# Patient Record
Sex: Female | Born: 1950 | Race: White | Hispanic: No | Marital: Married | Smoking: Never smoker
Health system: Southern US, Community
[De-identification: ages and names within clinical notes are randomized; demographics above are authoritative.]

## PROBLEM LIST (undated history)

## (undated) DIAGNOSIS — K5792 Diverticulitis of intestine, part unspecified, without perforation or abscess without bleeding: Secondary | ICD-10-CM

## (undated) DIAGNOSIS — E119 Type 2 diabetes mellitus without complications: Secondary | ICD-10-CM

## (undated) DIAGNOSIS — C439 Malignant melanoma of skin, unspecified: Secondary | ICD-10-CM

## (undated) DIAGNOSIS — E7800 Pure hypercholesterolemia, unspecified: Secondary | ICD-10-CM

## (undated) HISTORY — PX: SHOULDER SURGERY: SHX246

## (undated) HISTORY — PX: ABDOMINAL HYSTERECTOMY: SHX81

---

## 2003-04-05 ENCOUNTER — Other Ambulatory Visit: Admission: RE | Admit: 2003-04-05 | Discharge: 2003-04-05 | Payer: Self-pay | Admitting: Obstetrics and Gynecology

## 2004-02-17 ENCOUNTER — Ambulatory Visit: Payer: Self-pay | Admitting: Family Medicine

## 2005-09-15 ENCOUNTER — Ambulatory Visit: Payer: Self-pay | Admitting: Internal Medicine

## 2006-03-09 ENCOUNTER — Other Ambulatory Visit: Payer: Self-pay

## 2006-03-17 ENCOUNTER — Ambulatory Visit: Payer: Self-pay | Admitting: Obstetrics and Gynecology

## 2006-11-01 ENCOUNTER — Ambulatory Visit: Payer: Self-pay | Admitting: Unknown Physician Specialty

## 2006-11-02 ENCOUNTER — Ambulatory Visit: Payer: Self-pay | Admitting: Gastroenterology

## 2007-12-07 ENCOUNTER — Ambulatory Visit: Payer: Self-pay | Admitting: Internal Medicine

## 2008-01-02 ENCOUNTER — Ambulatory Visit: Payer: Self-pay | Admitting: Unknown Physician Specialty

## 2010-01-05 ENCOUNTER — Ambulatory Visit: Payer: Self-pay | Admitting: Urology

## 2010-01-26 ENCOUNTER — Ambulatory Visit: Payer: Self-pay | Admitting: Urology

## 2010-03-10 ENCOUNTER — Ambulatory Visit: Payer: Self-pay | Admitting: Unknown Physician Specialty

## 2010-03-17 ENCOUNTER — Ambulatory Visit: Payer: Self-pay | Admitting: Gynecologic Oncology

## 2010-03-29 ENCOUNTER — Ambulatory Visit: Payer: Self-pay | Admitting: Gynecologic Oncology

## 2010-09-17 ENCOUNTER — Ambulatory Visit: Payer: Self-pay | Admitting: Obstetrics and Gynecology

## 2010-09-18 ENCOUNTER — Ambulatory Visit: Payer: Self-pay | Admitting: Obstetrics and Gynecology

## 2010-09-28 ENCOUNTER — Ambulatory Visit: Payer: Self-pay | Admitting: Obstetrics and Gynecology

## 2010-10-01 LAB — PATHOLOGY REPORT

## 2012-01-18 ENCOUNTER — Ambulatory Visit: Payer: Self-pay | Admitting: Unknown Physician Specialty

## 2013-05-08 ENCOUNTER — Emergency Department: Payer: Self-pay | Admitting: Emergency Medicine

## 2013-05-08 ENCOUNTER — Encounter (HOSPITAL_COMMUNITY): Payer: Self-pay | Admitting: Emergency Medicine

## 2013-05-08 ENCOUNTER — Inpatient Hospital Stay (HOSPITAL_COMMUNITY)
Admission: EM | Admit: 2013-05-08 | Discharge: 2013-05-10 | DRG: 087 | Disposition: A | Discharge status: Home / Self Care | Payer: BC Managed Care – PPO | Attending: Surgery | Admitting: Surgery

## 2013-05-08 DIAGNOSIS — S069XAA Unspecified intracranial injury with loss of consciousness status unknown, initial encounter: Principal | ICD-10-CM | POA: Diagnosis present

## 2013-05-08 DIAGNOSIS — Y92009 Unspecified place in unspecified non-institutional (private) residence as the place of occurrence of the external cause: Secondary | ICD-10-CM

## 2013-05-08 DIAGNOSIS — S0101XA Laceration without foreign body of scalp, initial encounter: Secondary | ICD-10-CM | POA: Diagnosis present

## 2013-05-08 DIAGNOSIS — S069X9A Unspecified intracranial injury with loss of consciousness of unspecified duration, initial encounter: Secondary | ICD-10-CM | POA: Diagnosis present

## 2013-05-08 DIAGNOSIS — S42009A Fracture of unspecified part of unspecified clavicle, initial encounter for closed fracture: Secondary | ICD-10-CM

## 2013-05-08 DIAGNOSIS — S0181XA Laceration without foreign body of other part of head, initial encounter: Secondary | ICD-10-CM

## 2013-05-08 DIAGNOSIS — S52121A Displaced fracture of head of right radius, initial encounter for closed fracture: Secondary | ICD-10-CM | POA: Diagnosis present

## 2013-05-08 DIAGNOSIS — W108XXA Fall (on) (from) other stairs and steps, initial encounter: Secondary | ICD-10-CM | POA: Diagnosis present

## 2013-05-08 DIAGNOSIS — S42033A Displaced fracture of lateral end of unspecified clavicle, initial encounter for closed fracture: Secondary | ICD-10-CM | POA: Diagnosis present

## 2013-05-08 DIAGNOSIS — E785 Hyperlipidemia, unspecified: Secondary | ICD-10-CM | POA: Diagnosis present

## 2013-05-08 DIAGNOSIS — W19XXXA Unspecified fall, initial encounter: Secondary | ICD-10-CM

## 2013-05-08 DIAGNOSIS — I609 Nontraumatic subarachnoid hemorrhage, unspecified: Secondary | ICD-10-CM | POA: Diagnosis present

## 2013-05-08 DIAGNOSIS — S066X9A Traumatic subarachnoid hemorrhage with loss of consciousness of unspecified duration, initial encounter: Secondary | ICD-10-CM

## 2013-05-08 DIAGNOSIS — S52133A Displaced fracture of neck of unspecified radius, initial encounter for closed fracture: Secondary | ICD-10-CM | POA: Diagnosis present

## 2013-05-08 DIAGNOSIS — E7800 Pure hypercholesterolemia, unspecified: Secondary | ICD-10-CM | POA: Diagnosis present

## 2013-05-08 DIAGNOSIS — S066XAA Traumatic subarachnoid hemorrhage with loss of consciousness status unknown, initial encounter: Secondary | ICD-10-CM

## 2013-05-08 DIAGNOSIS — Z85820 Personal history of malignant melanoma of skin: Secondary | ICD-10-CM

## 2013-05-08 DIAGNOSIS — E119 Type 2 diabetes mellitus without complications: Secondary | ICD-10-CM | POA: Diagnosis present

## 2013-05-08 DIAGNOSIS — S42001A Fracture of unspecified part of right clavicle, initial encounter for closed fracture: Secondary | ICD-10-CM | POA: Diagnosis present

## 2013-05-08 HISTORY — DX: Malignant melanoma of skin, unspecified: C43.9

## 2013-05-08 HISTORY — DX: Pure hypercholesterolemia, unspecified: E78.00

## 2013-05-08 HISTORY — DX: Type 2 diabetes mellitus without complications: E11.9

## 2013-05-08 HISTORY — DX: Diverticulitis of intestine, part unspecified, without perforation or abscess without bleeding: K57.92

## 2013-05-08 LAB — CBC
HCT: 40.8 % (ref 35.0–47.0)
HGB: 13.8 g/dL (ref 12.0–16.0)
MCH: 29.5 pg (ref 26.0–34.0)
MCHC: 33.8 g/dL (ref 32.0–36.0)
MCV: 87 fL (ref 80–100)
Platelet: 350 10*3/uL (ref 150–440)
RBC: 4.67 10*6/uL (ref 3.80–5.20)
RDW: 13.7 % (ref 11.5–14.5)
WBC: 14.9 10*3/uL — AB (ref 3.6–11.0)

## 2013-05-08 LAB — BASIC METABOLIC PANEL
Anion Gap: 5 — ABNORMAL LOW (ref 7–16)
BUN: 13 mg/dL (ref 7–18)
CALCIUM: 8.9 mg/dL (ref 8.5–10.1)
CHLORIDE: 103 mmol/L (ref 98–107)
CREATININE: 0.6 mg/dL (ref 0.60–1.30)
Co2: 28 mmol/L (ref 21–32)
EGFR (Non-African Amer.): >60
Glucose: 148 mg/dL — ABNORMAL HIGH (ref 65–99)
OSMOLALITY: 275 (ref 275–301)
Potassium: 4 mmol/L (ref 3.5–5.1)
Sodium: 136 mmol/L (ref 136–145)

## 2013-05-08 LAB — GLUCOSE, CAPILLARY: Glucose-Capillary: 144 mg/dL — ABNORMAL HIGH (ref 70–99)

## 2013-05-08 LAB — APTT: Activated PTT: 28.6 secs (ref 23.6–35.9)

## 2013-05-08 LAB — PROTIME-INR
INR: 1
Prothrombin Time: 13.2 secs (ref 11.5–14.7)

## 2013-05-08 MED ORDER — MORPHINE SULFATE 4 MG/ML IJ SOLN
4.0000 mg | INTRAMUSCULAR | Status: DC | PRN
  Administered 2013-05-08 – 2013-05-09 (×3): 4 mg via INTRAVENOUS
  Filled 2013-05-08 (×3): qty 1

## 2013-05-08 MED ORDER — PANTOPRAZOLE SODIUM 40 MG IV SOLR
40.0000 mg | Freq: Every day | INTRAVENOUS | Status: DC
  Filled 2013-05-08: qty 40

## 2013-05-08 MED ORDER — PANTOPRAZOLE SODIUM 40 MG PO TBEC
40.0000 mg | DELAYED_RELEASE_TABLET | Freq: Every day | ORAL | Status: DC
  Administered 2013-05-09: 40 mg via ORAL
  Filled 2013-05-08: qty 1

## 2013-05-08 MED ORDER — ONDANSETRON HCL 4 MG/2ML IJ SOLN
4.0000 mg | Freq: Four times a day (QID) | INTRAMUSCULAR | Status: DC | PRN
  Administered 2013-05-08 – 2013-05-09 (×2): 4 mg via INTRAVENOUS
  Filled 2013-05-08 (×4): qty 2

## 2013-05-08 MED ORDER — MORPHINE SULFATE 2 MG/ML IJ SOLN: 1.0000 mg | INTRAMUSCULAR | Status: DC | PRN

## 2013-05-08 MED ORDER — POTASSIUM CHLORIDE IN NACL 20-0.9 MEQ/L-% IV SOLN
INTRAVENOUS | Status: DC
  Administered 2013-05-08: 1000 mL via INTRAVENOUS
  Filled 2013-05-08 (×2): qty 1000

## 2013-05-08 MED ORDER — ONDANSETRON HCL 4 MG PO TABS
4.0000 mg | ORAL_TABLET | Freq: Four times a day (QID) | ORAL | Status: DC | PRN
  Administered 2013-05-10: 4 mg via ORAL
  Filled 2013-05-08 (×2): qty 1

## 2013-05-08 MED ORDER — MORPHINE SULFATE 2 MG/ML IJ SOLN: 2.0000 mg | INTRAMUSCULAR | Status: DC | PRN

## 2013-05-08 NOTE — H&P (Signed)
History   Haley Ruiz is an 63 y.o. female.   Chief Complaint: This is a pleasant female transferred from [facility] after having fallen down steps. She denied loss of consciousness. She was found they are to have a small right subarachnoid hemorrhage on CT of the head. She was also found to have a right clavicle fracture. She was otherwise without injuries. Her GCS was 15. A scalp laceration was repaired and she was transferred here for neurosurgical evaluation. She denies neck pain, chest pain, or abdominal pain. Chief Complaint  Patient presents with  . Head Injury    Head Injury   Past Medical History  Diagnosis Date  . Diverticulitis   . Melanoma   . Diabetes mellitus without complication   . High cholesterol     Past Surgical History  Procedure Laterality Date  . Abdominal hysterectomy    . Shoulder surgery      History reviewed. No pertinent family history. Social History:  reports that she has never smoked. She has never used smokeless tobacco. She reports that she does not drink alcohol or use illicit drugs.  Allergies  No Known Allergies  Home Medications   (Not in a hospital admission)  Trauma Course  No results found for this or any previous visit (from the past 48 hour(s)). No results found.  Review of Systems  All other systems reviewed and are negative.    Blood pressure 122/70, pulse 94, temperature 99.3 F (37.4 C), temperature source Oral, resp. rate 25, SpO2 98.00%. Physical Exam  Constitutional: She is oriented to person, place, and time. She appears well-developed and well-nourished.  HENT:  Head: Normocephalic.  Right Ear: External ear normal.  Left Ear: External ear normal.  Nose: Nose normal.  Mouth/Throat: Oropharynx is clear and moist. No oropharyngeal exudate.  Small laceration the sutures on the right scalp  Eyes: Conjunctivae are normal. Pupils are equal, round, and reactive to light.  Neck: Normal range of motion. Neck  supple. No tracheal deviation present.  Cervical spine nontender  Cardiovascular: Normal rate, regular rhythm, normal heart sounds and intact distal pulses.   Respiratory: Effort normal and breath sounds normal. No respiratory distress. She has no wheezes. She has no rales. She exhibits no tenderness.  GI: Soft. Bowel sounds are normal. She exhibits no distension. There is no tenderness. There is no rebound.  Musculoskeletal: Normal range of motion. She exhibits tenderness.  Tenderness along the right clavicle distally  Neurological: She is alert and oriented to person, place, and time.  Skin: Skin is warm and dry. No rash noted. No erythema.  Psychiatric: Her behavior is normal. Judgment normal.     Assessment/Plan Patient status post fall with the following injuries:  1. 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 Neurosurgery has been consulted 2. Right clavicle fracture. Orthopedics has been consulted  Patient will be placed in the step down unit and a CAT scan of the head will be repeated in the morning.  Demetria Lightsey A 05/08/2013, 10:17 PM   Procedures

## 2013-05-08 NOTE — Consult Note (Signed)
     ORTHOPAEDIC CONSULTATION  REQUESTING PHYSICIAN: Trauma Md, MD  Chief Complaint: Right clavicle fx  HPI: Haley Ruiz is a 63 y.o. female who complains of  Fall down the stairs, pain at R shoulder  Past Medical History  Diagnosis Date  . Diverticulitis   . Melanoma   . Diabetes mellitus without complication   . High cholesterol    Past Surgical History  Procedure Laterality Date  . Abdominal hysterectomy    . Shoulder surgery     History   Social History  . Marital Status: Married    Spouse Name: N/A    Number of Children: N/A  . Years of Education: N/A   Social History Main Topics  . Smoking status: Never Smoker   . Smokeless tobacco: Never Used  . Alcohol Use: No  . Drug Use: No  . Sexual Activity: None   Other Topics Concern  . None   Social History Narrative  . None   History reviewed. No pertinent family history. No Known Allergies Prior to Admission medications   Medication Sig Start Date End Date Taking? Authorizing Provider  atorvastatin (LIPITOR) 10 MG tablet Take 10 mg by mouth daily.   Yes Historical Provider, MD  guaiFENesin (MUCINEX) 600 MG 12 hr tablet Take 1,200 mg by mouth 2 (two) times daily.   Yes Historical Provider, MD  metFORMIN (GLUCOPHAGE) 500 MG tablet Take 1,000 mg by mouth at bedtime.    Yes Historical Provider, MD   No results found.  Positive ROS: All other systems have been reviewed and were otherwise negative with the exception of those mentioned in the HPI and as above.  Labs cbc No results found for this basename: WBC, HGB, HCT, PLT,  in the last 72 hours  Labs inflam No results found for this basename: ESR, CRP,  in the last 72 hours  Labs coag No results found for this basename: INR, PT, PTT,  in the last 72 hours  No results found for this basename: NA, K, CL, CO2, GLUCOSE, BUN, CREATININE, CALCIUM,  in the last 72 hours  Physical Exam: Filed Vitals:   05/08/13 1915  BP: 124/59  Pulse: 88  Temp:   Resp: 17    General: Alert, no acute distress Cardiovascular: No pedal edema Respiratory: No cyanosis, no use of accessory musculature GI: No organomegaly, abdomen is soft and non-tender Skin: No lesions in the area of chief complaint Neurologic: Sensation intact distally Psychiatric: Patient is competent for consent with normal mood and affect Lymphatic: No axillary or cervical lymphadenopathy  MUSCULOSKELETAL:  SILT M/R/U nerve, 2+ radial pulse, +EPL/FPL/IO Compartments soft TTP at distal clavicle  Other extremities are atraumatic with painless ROM and NVI.  Assessment: Right distal clavicle fracture  Plan: Non-operative management Weight Bearing Status: Sling full time Dispo: ok for d/c from ortho standpoint.   F/u with me in 1-2wks.    Edmonia Lynch, D, MD Cell [phone]   05/08/2013 8:15 PM

## 2013-05-08 NOTE — Consult Note (Signed)
Reason for Consult:chi Referring Physician: [name] is an 63 y.o. female.  HPI: patient who fell down steps while carrying laundry. No loc. Went to [facility] and [facility]. C/o pain in the right shoulder  Past Medical History  Diagnosis Date  . Diverticulitis   . Melanoma   . Diabetes mellitus without complication   . High cholesterol     Past Surgical History  Procedure Laterality Date  . Abdominal hysterectomy    . Shoulder surgery      History reviewed. No pertinent family history.  Social History:  reports that she has never smoked. She has never used smokeless tobacco. She reports that she does not drink alcohol or use illicit drugs.  Allergies: No Known Allergies  Medications: seE HP  No results found for this or any previous visit (from the past 25 hour(s)).  No results found.  Review of Systems  Constitutional: Negative.   Eyes: Negative.   Respiratory: Negative.   Cardiovascular: Negative.   Gastrointestinal: Negative.   Genitourinary: Negative.   Musculoskeletal: Positive for joint pain.  Skin: Negative.   Neurological: Positive for headaches.  Endo/Heme/Allergies: Negative.   Psychiatric/Behavioral: Negative.    Blood pressure 124/59, pulse 88, temperature 99.3 F (37.4 C), temperature source Oral, resp. rate 17, SpO2 100.00%. Physical Exam HENT, LACERATION RIGHT FRONTAL AREA. NO CSF OR BLOOD IN EARS OR NOSE. Neck., no tenderness. Cv, nl. Lungs. Clear. [place]. Soft. Extremities, sling to right shoulder. Neuro, oriented x 3. No weakness with pain in right arm, sensory, nl. Ct head small traumatic sah with  No shift.  Assessment/Plan: Obeservation. Repeat ct head in am Haley Ruiz M 05/08/2013, 8:31 PM

## 2013-05-08 NOTE — ED Notes (Signed)
Called flow regarding delay in pt's inpatient bed. Flow states the pt was not put in her admit list. Finding a inpatient bed now

## 2013-05-08 NOTE — ED Provider Notes (Signed)
CSN: [id number]     Arrival date & time 05/08/13  1842 History   First MD Initiated Contact with Patient 05/08/13 1843     Chief Complaint  Patient presents with  . Head Injury     (Consider location/radiation/quality/duration/timing/severity/associated sxs/prior Treatment) HPI Comments: 63 year old female presents as a transfer from [place] after falling down the stairs and injuring her head. She's unsure she lost consciousness. She's not have any weakness or numbness. No neck pain. She was diagnosed with a broken distal right clavicle and possible subdural versus subarachnoid bleeding. It appears the CT scan was equivocal per the radiology read. Patient received morphine by EMS states her pain is improved. She had her laceration over her right temple sutured by the other ED.    Past Medical History  Diagnosis Date  . Diverticulitis   . Melanoma    Past Surgical History  Procedure Laterality Date  . Abdominal hysterectomy     No family history on file. History  Substance Use Topics  . Smoking status: Never Smoker   . Smokeless tobacco: Not on file  . Alcohol Use: No   OB History   Grav Para Term Preterm Abortions TAB SAB Ect Mult Living                 Review of Systems  Respiratory: Negative for shortness of breath.   Cardiovascular: Negative for chest pain.  Gastrointestinal: Negative for vomiting and abdominal pain.  Musculoskeletal: Negative for back pain, neck pain and neck stiffness.  Neurological: Positive for headaches. Negative for dizziness, weakness and light-headedness.  All other systems reviewed and are negative.      Allergies  Review of patient's allergies indicates no known allergies.  Home Medications  No current outpatient prescriptions on file. There were no vitals taken for this visit. Physical Exam  Nursing note and vitals reviewed. Constitutional: She is oriented to person, place, and time. She appears well-developed and well-nourished.  No distress.  HENT:  Head: Normocephalic.  Right Ear: External ear normal.  Left Ear: External ear normal.  Nose: Nose normal.  Mouth/Throat: Oropharynx is clear and moist.  3 cm laceration over her right temple with sutures in place  Eyes: EOM are normal. Pupils are equal, round, and reactive to light.  Neck: Normal range of motion. Neck supple.  No neck tenderness  Cardiovascular: Normal rate, regular rhythm, normal heart sounds and intact distal pulses.   Pulses:      Radial pulses are 2+ on the right side, and 2+ on the left side.  Pulmonary/Chest: Effort normal and breath sounds normal. No respiratory distress. She has no wheezes. She has no rales.  Abdominal: Soft. She exhibits no distension. There is no tenderness.  Musculoskeletal: She exhibits no edema.       Right shoulder: She exhibits tenderness (over distal clavicle).  Neurological: She is alert and oriented to person, place, and time. She has normal strength. No cranial nerve deficit or sensory deficit. GCS eye subscore is 4. GCS verbal subscore is 5. GCS motor subscore is 6.  CN 2-12 grossly intact. 5/5 strength in all 4 extremities.  Skin: Skin is warm and dry. She is not diaphoretic. No pallor.    ED Course  Procedures (including critical care time) Labs Review Labs Reviewed - No data to display Imaging Review No results found.  EKG Interpretation   None       MDM   Final diagnoses:  Fall at home  Forehead laceration  Right  clavicle fracture    Patient is well-appearing, and has a nonfocal neuro exam. Dr. Ninfa Linden was here on arrival as well and will admit her for close neurologic observation and neurosurgical and orthopedic consultation.    Ephraim Hamburger, MD 05/09/13 [phone]

## 2013-05-08 NOTE — ED Notes (Signed)
Per EMS pt fell down 7 steps at home, went to [place] by EMS. Pt had laceration repair to right forehead. No LOC. No blood thinners. CT scan showed possible subarachnoid hemmorrhage. Pt has pain to right arm, clavicle broken. Alert and oriented x 4. LAC 20G. Pt transferred by carelink from [facility]. Pt received morphine 4mg  at 1806. VS BP 130/71 HR94 O295%RA.

## 2013-05-09 ENCOUNTER — Inpatient Hospital Stay (HOSPITAL_COMMUNITY): Payer: BC Managed Care – PPO

## 2013-05-09 ENCOUNTER — Encounter (HOSPITAL_COMMUNITY): Payer: Self-pay | Admitting: Radiology

## 2013-05-09 LAB — GLUCOSE, CAPILLARY
GLUCOSE-CAPILLARY: 128 mg/dL — AB (ref 70–99)
Glucose-Capillary: 109 mg/dL — ABNORMAL HIGH (ref 70–99)
Glucose-Capillary: 133 mg/dL — ABNORMAL HIGH (ref 70–99)
Glucose-Capillary: 171 mg/dL — ABNORMAL HIGH (ref 70–99)

## 2013-05-09 LAB — CBC
HEMATOCRIT: 36.6 % (ref 36.0–46.0)
Hemoglobin: 12.1 g/dL (ref 12.0–15.0)
MCH: 29 pg (ref 26.0–34.0)
MCHC: 33.1 g/dL (ref 30.0–36.0)
MCV: 87.8 fL (ref 78.0–100.0)
Platelets: 319 10*3/uL (ref 150–400)
RBC: 4.17 MIL/uL (ref 3.87–5.11)
RDW: 13.4 % (ref 11.5–15.5)
WBC: 9.5 10*3/uL (ref 4.0–10.5)

## 2013-05-09 LAB — BASIC METABOLIC PANEL
BUN: 13 mg/dL (ref 6–23)
CO2: 23 mEq/L (ref 19–32)
Calcium: 8.1 mg/dL — ABNORMAL LOW (ref 8.4–10.5)
Chloride: 102 mEq/L (ref 96–112)
Creatinine, Ser: 0.55 mg/dL (ref 0.50–1.10)
Glucose, Bld: 147 mg/dL — ABNORMAL HIGH (ref 70–99)
Potassium: 5 mEq/L (ref 3.7–5.3)
SODIUM: 138 meq/L (ref 137–147)

## 2013-05-09 LAB — MRSA PCR SCREENING: MRSA by PCR: NEGATIVE

## 2013-05-09 MED ORDER — ATORVASTATIN CALCIUM 10 MG PO TABS
10.0000 mg | ORAL_TABLET | Freq: Every day | ORAL | Status: DC
  Administered 2013-05-09: 10 mg via ORAL
  Filled 2013-05-09 (×2): qty 1

## 2013-05-09 MED ORDER — HYDROCODONE-ACETAMINOPHEN 5-325 MG PO TABS
1.0000 | ORAL_TABLET | ORAL | Status: DC | PRN
  Administered 2013-05-09: 1 via ORAL
  Administered 2013-05-09 – 2013-05-10 (×3): 2 via ORAL
  Filled 2013-05-09: qty 1
  Filled 2013-05-09 (×3): qty 2

## 2013-05-09 MED ORDER — SODIUM CHLORIDE 0.9 % IV SOLN
INTRAVENOUS | Status: DC
  Administered 2013-05-09: 10 mL/h via INTRAVENOUS

## 2013-05-09 MED ORDER — GUAIFENESIN ER 600 MG PO TB12
1200.0000 mg | ORAL_TABLET | Freq: Two times a day (BID) | ORAL | Status: DC
  Administered 2013-05-09 – 2013-05-10 (×3): 1200 mg via ORAL
  Filled 2013-05-09 (×4): qty 2

## 2013-05-09 NOTE — Progress Notes (Signed)
LOS: 1 day   Subjective: Pt feels good, pain in right shoulder/clavicle and head.  No N/V, no trouble with memory or balance.  Tolerating some clears, but appetite low.  Having BM's and urinating well.  Pain well controlled.  Objective: Vital signs in last 24 hours: Temp:  [98.3 F (36.8 C)-99.3 F (37.4 C)] 98.3 F (36.8 C) (02/11 0723) Pulse Rate:  [84-94] 85 (02/11 0412) Resp:  [16-26] 18 (02/11 0412) BP: (90-133)/(46-70) 107/49 mmHg (02/11 0412) SpO2:  [94 %-100 %] 97 % (02/11 0412) Weight:  [171 lb 8.3 oz (77.8 kg)] 171 lb 8.3 oz (77.8 kg) (02/10 2300) Last BM Date: 05/08/13  Lab Results:  CBC  Recent Labs  05/09/13 0231  WBC 9.5  HGB 12.1  HCT 36.6  PLT 319   BMET  Recent Labs  05/09/13 0231  NA 138  K 5.0  CL 102  CO2 23  GLUCOSE 147*  BUN 13  CREATININE 0.55  CALCIUM 8.1*    Imaging: Dg Clavicle Right  05/09/2013   CLINICAL DATA:  Right shoulder pain since a fall yesterday.  EXAM: RIGHT CLAVICLE - 2+ VIEWS  COMPARISON:  None.  FINDINGS: There is a slightly displaced longitudinal oblique fracture through the distal right clavicle.  IMPRESSION: Distal right clavicle fracture.   Electronically Signed   By: Rozetta Nunnery M.D.   On: 05/09/2013 08:02   Ct Head Without Contrast  05/09/2013   CLINICAL DATA:  Traumatic brain injury.  Follow-up evaluation.  EXAM: CT HEAD WITHOUT CONTRAST  TECHNIQUE: Contiguous axial images were obtained from the base of the skull through the vertex without intravenous contrast.  COMPARISON:  CT of the head May 08, 2013 at [facility].  FINDINGS: The curvilinear extra-axial hyperdensity within the right frontal sulcus on prior examination is no longer apparent. No extra-axial fluid collections on today's examination.  The ventricles and sulci are normal for age. No intraparenchymal hemorrhage, mass effect nor midline shift. Minimal supratentorial white matter hypodensities are within normal range for patient's  age and though non-specific suggest sequelae of chronic small vessel ischemic disease. No acute large vascular territory infarcts.  Basal cisterns are patent. Minimal calcific atherosclerosis of the carotid siphons. Calcified subcentimeter cranial gland.  Small right frontal scalp hematoma with bubbles of gas most consistent with laceration, no radiopaque foreign bodies. No skull fracture. Mild paranasal sinus mucosal thickening without air-fluid levels. . The included ocular globes and orbital contents are non-suspicious.  IMPRESSION: Small right frontal scalp hematoma with apparent laceration, no underlying skull fracture and no acute intracranial process. Resolution of right frontal subarachnoid blood versus artifact from prior CT.   Electronically Signed   By: Elon Alas   On: 05/09/2013 03:47     PE: General: pleasant, WD/WN white female who is laying in bed in NAD HEENT: head is normocephalic, laceration to right scalp.  Sclera are noninjected.  PERRL.  Ears and nose without any masses or lesions, no drainage.  Mouth is pink and moist Heart: regular, rate, and rhythm.  Normal s1,s2. No obvious murmurs, gallops, or rubs noted.  Palpable radial and pedal pulses bilaterally Lungs: CTAB, no wheezes, rhonchi, or rales noted.  Respiratory effort nonlabored Abd: soft, NT/ND, +BS, no masses, hernias, or organomegaly MS: Right shoulder with edema and tenderness over right clavicle, rest of extremties without edema, ecchymosis, or cyanosis, CSM intact b/l x 4 extremities Skin: warm and dry with no masses, lesions, or rashes Psych: A&Ox3 with an appropriate affect, no  amnesia noted in current or historical events, recalls the accident well   Assessment/Plan: Fall down stairs TBI/Right SAH - pending repeat CT head Scalp laceration - repaired at [place] Right clavicle fx - non-op with sling, f/u with Dr. Percell Miller VTE - SCD's, no pharm DVT proph due to head bleed FEN - on clears will advance after  CT scan to soft Dispo -- Depending on CT scan/therapies may be able to go home today vs tomorrow, therapies after CT scan   Excell Seltzer Pager: Phelps PA Pager: [phone]   05/09/2013

## 2013-05-09 NOTE — Progress Notes (Signed)
UR completed.  Ruchy Wildrick, RN BSN M[facility] CCM Trauma/Neuro ICU Case Manager [phone]  

## 2013-05-09 NOTE — Discharge Instructions (Signed)
Keep your Right arm in a sling full time.   Ok to remove sling for showering

## 2013-05-09 NOTE — Evaluation (Signed)
Physical Therapy Evaluation Patient Details Name: Haley Ruiz MRN: [id number] DOB: May 03, 1950 Today's Date: 05/09/2013 Time: 6073-7106 PT Time Calculation (min): 22 min  PT Assessment / Plan / Recommendation History of Present Illness  This is a pleasant female transferred from [facility] after having fallen down steps. She denied loss of consciousness. She was found they are to have a small right subarachnoid hemorrhage on CT of the head. She was also found to have a right clavicle fracture. She was otherwise without injuries. Her GCS was 15. A scalp laceration was repaired and she was transferred here for neurosurgical evaluation. She denies neck pain, chest pain, or abdominal pain.  Clinical Impression  Pt adm due to the above. Presents with limitations in mobility due to deficits indicated below (see PT problem list). Pt to benefit from skilled acute PT to increase independence with mobility prior to returning home with husband. Pt educated on TBI signs/symptoms. Pt wearing Rt immobilizer on shoulder at this time; has sling ordered per MD. Will benefit from acute OT prior to D/C.   PT Assessment  Patient needs continued PT services    Follow Up Recommendations  No PT follow up;Other (comment);Supervision/Assistance - 24 hour (may need PT when Rt shoulder is medically cleared )    Does the patient have the potential to tolerate intense rehabilitation      Barriers to Discharge        Equipment Recommendations  None recommended by PT    Recommendations for Other Services OT consult   Frequency Min 4X/week    Precautions / Restrictions Precautions Precautions: Fall Precaution Comments: reports this to be her only recent fall  Restrictions Weight Bearing Restrictions: No   Pertinent Vitals/Pain 7/10; Rt shoulder. patient repositioned for comfort with Rt shoulder elevated.       Mobility  Bed Mobility Overal bed mobility: Modified Independent Transfers Overall  transfer level: Needs assistance Equipment used: 1 person hand held assist Transfers: Sit to/from Stand Sit to Stand: Supervision General transfer comment: supervision for safety; pt reaching for support of Lt UE; cues for sequencing; no LOB noted; min sway  Ambulation/Gait Ambulation/Gait assistance: Supervision;Min guard Ambulation Distance (Feet): 250 Feet Assistive device: None Gait Pattern/deviations: Decreased stride length;Step-through pattern;Narrow base of support Gait velocity: decreased; guarded due to pain  Gait velocity interpretation: Below normal speed for age/gender General Gait Details: initially pt min guard for ambulation due to minimal sway noted; progressed to supervision; decreased speed due to pain/guarded  Stairs: Yes Stairs assistance: Min guard Stair Management: One rail Left;Step to pattern;Forwards Number of Stairs: 2 General stair comments: min guard for safety; cues for sequencing          PT Diagnosis: Abnormality of gait;Acute pain  PT Problem List: Decreased activity tolerance;Decreased balance;Decreased mobility;Pain;Decreased knowledge of precautions PT Treatment Interventions: DME instruction;Gait training;Stair training;Functional mobility training;Therapeutic activities;Therapeutic exercise;Balance training;Neuromuscular re-education;Patient/family education     PT Goals(Current goals can be found in the care plan section) Acute Rehab PT Goals Patient Stated Goal: to go home today or tomorrow PT Goal Formulation: With patient Time For Goal Achievement: 05/23/13 Potential to Achieve Goals: Good  Visit Information  Last PT Received On: 05/09/13 Assistance Needed: +1 History of Present Illness: This is a pleasant female transferred from [facility] after having fallen down steps. She denied loss of consciousness. She was found they are to have a small right subarachnoid hemorrhage on CT of the head. She was also found to have a right  clavicle fracture. She  was otherwise without injuries. Her GCS was 15. A scalp laceration was repaired and she was transferred here for neurosurgical evaluation. She denies neck pain, chest pain, or abdominal pain.       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 Dominant Hand: Right    Cognition  Cognition Arousal/Alertness: Awake/alert Behavior During Therapy: WFL for tasks assessed/performed Overall Cognitive Status: Within Functional Limits for tasks assessed    Extremity/Trunk Assessment Upper Extremity Assessment Upper Extremity Assessment: Defer to OT evaluation (Limited Rt UE; in immobilizer ) Lower Extremity Assessment Lower Extremity Assessment: Overall WFL for tasks assessed Cervical / Trunk Assessment Cervical / Trunk Assessment: Normal   Balance Balance Overall balance assessment: Needs assistance;History of Falls Sitting-balance support: Feet supported;No upper extremity supported Sitting balance-Leahy Scale: Good Standing balance support: During functional activity;No upper extremity supported Standing balance-Leahy Scale: Fair  End of Session PT - End of Session Equipment Utilized During Treatment: Gait belt;Other (comment) (Rt shoulder immobilizer ) Activity Tolerance: Patient tolerated treatment well Patient left: in chair;with call bell/phone within reach;with  family/visitor present Nurse Communication: Mobility status;Precautions  GP     Haley Ruiz, Haley Ruiz 05/09/2013, 11:29 AM

## 2013-05-09 NOTE — Evaluation (Signed)
Occupational Therapy Evaluation Patient Details Name: Haley Ruiz MRN: [id number] DOB: [birth year] Today's Date: 05/09/2013 Time: 7253-6644 OT Time Calculation (min): 21 min  OT Assessment / Plan / Recommendation History of present illness This is a pleasant female transferred from [facility] after having fallen down steps. She denied loss of consciousness. She was found they are to have a small right subarachnoid hemorrhage on CT of the head. She was also found to have a right clavicle fracture. She was otherwise without injuries. Her GCS was 15. A scalp laceration was repaired and she was transferred here for neurosurgical evaluation. She denies neck pain, chest pain, or abdominal pain.   Clinical Impression   Pt presents with below problem list. Pt independent with ADLs, PTA. Feel pt will benefit from acute OT to increase independence prior to d/c.     OT Assessment  Patient needs continued OT Services    Follow Up Recommendations  No OT follow up;Supervision - Intermittent (when OOB/mobility)   Barriers to Discharge      Equipment Recommendations  Other (comment) (may benefit from shower chair)    Recommendations for Other Services    Frequency  Min 2X/week    Precautions / Restrictions Precautions Precautions: Fall Precaution Comments: reports this to be her only recent fall  Restrictions Weight Bearing Restrictions: Yes RUE Weight Bearing: Non weight bearing Other Position/Activity Restrictions: no pushing, pulling, lifting   Pertinent Vitals/Pain Pain 3-4/10 in Right Shoulder. C/o elbow pain with exercises. Nurse notified of elbow pain.     ADL  Grooming: Wash/dry face;Teeth care;Min guard Where Assessed - Grooming: Unsupported sitting;Supported standing Lower Body Dressing: Min guard Where Assessed - Lower Body Dressing: Unsupported sit to stand Toilet Transfer: Min Psychiatric nurse Method: Sit to Loss adjuster, chartered: Regular height  toilet Tub/Shower Transfer Method: Not assessed Equipment Used: Gait belt;Other (comment) (shoulder sling) Transfers/Ambulation Related to ADLs: Min guard ADL Comments: Reviewed shoulder information with pt. She became nauseous during session.  Educated/demonstrated on donning/doffing shoulder sling    OT Diagnosis: Acute pain  OT Problem List: Decreased activity tolerance;Impaired balance (sitting and/or standing);Decreased knowledge of use of DME or AE;Decreased knowledge of precautions;Pain;Impaired UE functional use OT Treatment Interventions: Self-care/ADL training;Therapeutic exercise;DME and/or AE instruction;Therapeutic activities;Patient/family education;Balance training   OT Goals(Current goals can be found in the care plan section) Acute Rehab OT Goals Patient Stated Goal: not stated OT Goal Formulation: With patient Time For Goal Achievement: 05/16/13 Potential to Achieve Goals: Good ADL Goals Pt Will Perform Upper Body Dressing: with modified independence;with caregiver independent in assisting;sitting Pt Will Transfer to Toilet: with modified independence;ambulating;regular height toilet Additional ADL Goal #1: Pt will independently perform HEP for RUE. Additional ADL Goal #2: Pt/caregiver will be independent in performing ADLs while maintaining precautions.   Visit Information  Last OT Received On: 05/09/13 Assistance Needed: +1 History of Present Illness: This is a pleasant female transferred from [facility] after having fallen down steps. She denied loss of consciousness. She was found they are to have a small right subarachnoid hemorrhage on CT of the head. She was also found to have a right clavicle fracture. She was otherwise without injuries. Her GCS was 15. A scalp laceration was repaired and she was transferred here for neurosurgical evaluation. She denies neck pain, chest pain, or abdominal pain.       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 Dominant Hand: Right         Vision/Perception     Solicitor Arousal/Alertness: Awake/alert Behavior During Therapy: WFL for tasks assessed/performed Overall Cognitive Status: Within Functional Limits for tasks assessed    Extremity/Trunk Assessment Upper Extremity Assessment Upper Extremity Assessment: RUE deficits/detail RUE Deficits / Details: clavicle fx Lower Extremity Assessment Lower Extremity Assessment: Defer to PT evaluation     Mobility Bed Mobility Overal bed mobility: Needs Assistance Bed Mobility: Sit to Supine;Supine to Sit Supine to sit: Supervision Sit to supine: Supervision General bed mobility comments: supervision to be sure she maintained precautions. Transfers Overall transfer level: Needs assistance Equipment used: None Transfers: Sit to/from Stand Sit to Stand: Min guard General transfer comment: Cues for technique for toilet transfer.     Exercise Shoulder Exercises Elbow Flexion: AROM;Right;10 reps;Standing Elbow Extension: AROM;Right;10 reps;Standing Wrist Flexion: AROM;Right;10 reps;Standing Wrist Extension: AROM;Right;10 reps;Standing Digit Composite Flexion: AROM;Right;10 reps;Standing Composite Extension: AROM;Right;10 reps;Standing Donning/doffing shirt without moving shoulder:  (educated) Method for sponge bathing under operated UE:   (pt able to verbalize) Donning/doffing sling/immobilizer:  (educated/demonstrated) Correct positioning of sling/immobilizer: Patient able to independently direct caregiver ROM for elbow, wrist and digits of operated UE: Supervision/safety Sling wearing schedule (on at all times/off for ADL's):  (educated) Proper positioning of operated UE when showering:  (educated) Positioning of UE while sleeping:  (educated)   Balance     End of Session OT - End of Session Equipment Utilized During Treatment: Gait belt Activity Tolerance: Other (comment) (nauseous) Patient left: in bed;with call bell/phone within reach;with bed alarm set Nurse Communication:  (pain in elbow; nauseous)  [place], [place] L OTR/L [phone] 05/09/2013, 5:11 PM

## 2013-05-09 NOTE — Progress Notes (Signed)
Report called to Joelene Millin, RN 4N. Q4 hour neuro checks, no deficits. Husband at bedside. VSS. All belongings sent with patient. No meds to send. Pt transported via wheelchair by NT. eICU and CCMT notified of transfer. Will continue to monitor.

## 2013-05-09 NOTE — Progress Notes (Signed)
Orthopedic Tech Progress Note Patient Details:  Haley Ruiz 07/16/1950 [id number]  Ortho Devices Type of Ortho Device: Sling immobilizer Ortho Device/Splint Interventions: Application   Cammer, Theodoro Parma 05/09/2013, 3:00 PM

## 2013-05-09 NOTE — Progress Notes (Signed)
Patient ID: Haley Ruiz, female   DOB: 1950/04/24, 63 y.o.   MRN: [id number] Neuro stable, devrease of ha. Ct head shows resolution of traumatic csf. From our point she can be dc and be f/u by [place] PRN

## 2013-05-09 NOTE — Progress Notes (Signed)
Repeat CT head this AM was without worsening.  Okay to transfer to the floor.  GCS 15.  KVO IVF, PT/OT, advance diet, to floor later today.  No additional CT head required.  This patient has been seen and I agree with the findings and treatment plan.  Kathryne Eriksson. Dahlia Bailiff, MD, [place] [phone] (pager) [phone] (direct pager) Trauma Surgeon

## 2013-05-10 ENCOUNTER — Inpatient Hospital Stay (HOSPITAL_COMMUNITY): Payer: BC Managed Care – PPO

## 2013-05-10 DIAGNOSIS — W19XXXA Unspecified fall, initial encounter: Secondary | ICD-10-CM | POA: Diagnosis present

## 2013-05-10 DIAGNOSIS — S0101XA Laceration without foreign body of scalp, initial encounter: Secondary | ICD-10-CM | POA: Diagnosis present

## 2013-05-10 DIAGNOSIS — K5790 Diverticulosis of intestine, part unspecified, without perforation or abscess without bleeding: Secondary | ICD-10-CM | POA: Insufficient documentation

## 2013-05-10 DIAGNOSIS — E785 Hyperlipidemia, unspecified: Secondary | ICD-10-CM | POA: Insufficient documentation

## 2013-05-10 DIAGNOSIS — S42001A Fracture of unspecified part of right clavicle, initial encounter for closed fracture: Secondary | ICD-10-CM | POA: Diagnosis present

## 2013-05-10 DIAGNOSIS — S52121A Displaced fracture of head of right radius, initial encounter for closed fracture: Secondary | ICD-10-CM | POA: Diagnosis present

## 2013-05-10 DIAGNOSIS — E119 Type 2 diabetes mellitus without complications: Secondary | ICD-10-CM | POA: Insufficient documentation

## 2013-05-10 LAB — GLUCOSE, CAPILLARY: GLUCOSE-CAPILLARY: 116 mg/dL — AB (ref 70–99)

## 2013-05-10 MED ORDER — ONDANSETRON HCL 4 MG PO TABS: 4.0000 mg | ORAL_TABLET | Freq: Four times a day (QID) | ORAL | Status: AC | PRN

## 2013-05-10 MED ORDER — HYDROCODONE-ACETAMINOPHEN 5-325 MG PO TABS: 1.0000 | ORAL_TABLET | ORAL | Status: AC | PRN

## 2013-05-10 NOTE — Progress Notes (Signed)
I participated in the care of this patient and agree with the above history, physical and evaluation. I performed a review of the history and a physical exam as detailed    R elbow xray: minimally displaced radial neck fracture Plan for non-ooperative care of this as well. Sling full time, limit elbow ROM for 2-3wks then as tolerated. NWB RUE  Carole Binning MD

## 2013-05-10 NOTE — Progress Notes (Signed)
PT Cancellation Note  Patient Details Name: Haley Ruiz MRN: [id number] DOB: Sep 04, 1950   Cancelled Treatment:    Reason Eval/Treat Not Completed: Patient at procedure or test/unavailable. Patient going for Xrays. Will follow up.    [name] 05/10/2013, 8:48 AM

## 2013-05-10 NOTE — Progress Notes (Signed)
     Subjective:  Patient reports pain as moderate.  Patient sitting in bed, alert, complaining of pain in her right elbow.    Objective:   VITALS:   Filed Vitals:   05/09/13 1913 05/09/13 2041 05/10/13 0222 05/10/13 0513  BP: 137/60 116/54 106/51 137/58  Pulse: 81 83 80 82  Temp: 98 F (36.7 C) 99 F (37.2 C) 98.2 F (36.8 C) 99 F (37.2 C)  TempSrc: Oral Oral Oral Oral  Resp: 18 18 18 18   Height:      Weight:      SpO2: 100% 100% 100% 98%    Neurovascular intact Sensation intact distally Intact pulses distally Incision: dressing C/D/I and no drainage Able to supinate and pronate RUE.  Pain with supination.   Able to flex, extend wrist.  Able to flex, extend, abduct all fingers.   Lab Results  Component Value Date   WBC 9.5 05/09/2013   HGB 12.1 05/09/2013   HCT 36.6 05/09/2013   MCV 87.8 05/09/2013   PLT 319 05/09/2013     Assessment/Plan:     Active Problems:   TBI (traumatic brain injury)   Advance diet Up with therapy Sling at all times NWB RUE X-ray of right elbow ordered Continue plan per trauma Dry dressing prn   Remonia Richter 05/10/2013, 7:16 AM   Edmonia Lynch MD [phone]

## 2013-05-10 NOTE — Progress Notes (Signed)
Patient ID: Haley Ruiz, female   DOB: [birth year], 63 y.o.   MRN: [id number] Had  One episode of lightheadness and some nausea. Neuro stable

## 2013-05-10 NOTE — Discharge Summary (Signed)
Physician Discharge Summary  Patient ID: Haley Ruiz MRN: [id number] DOB/AGE: 61-Aug-1952 61 y.o.  Admit date: 05/08/2013 Discharge date: 05/10/2013  Discharge Diagnoses Patient Active Problem List   Diagnosis Date Noted  . Fall 05/10/2013  . Right clavicle fracture 05/10/2013  . Right radial head fracture 05/10/2013  . Scalp laceration 05/10/2013  . Diverticular disease 05/10/2013  . DM (diabetes mellitus) 05/10/2013  . Hyperlipidemia 05/10/2013  . TBI (traumatic brain injury) 05/08/2013    Consultants Dr. Edmonia Lynch for orthopedic surgery  Dr. Leeroy Cha for neurosurgery   Procedures None (repair of scalp laceration done at outside hospital)   HPI: [name] was transferred from [facility] after having fallen down some steps. She denied loss of consciousness. She was found there to have a small right subarachnoid hemorrhage on CT of the head. She was also found to have a right clavicle fracture. Her GCS was 15. A scalp laceration was repaired and she was transferred here for neurosurgical evaluation. She was admitted to the trauma service and neurosurgery and orthopedic surgery were consulted.   Hospital Course: Neurosurgery recommended non-operative treatment of her head injury as did orthopedic surgery for her clavicle fracture. A repeat head CT the following day was stable. She was also complaining of some right elbow pain and an x-ray here did show a radial head fracture that required no additional treatment. She was mobilized with physical and occupational therapies and did well, her pain was controlled on oral medication, and she was discharged home in good condition.      Medication List         atorvastatin 10 MG tablet  Commonly known as:  LIPITOR  Take 10 mg by mouth daily.     guaiFENesin 600 MG 12 hr tablet  Commonly known as:  MUCINEX  Take 1,200 mg by mouth 2 (two) times daily.     HYDROcodone-acetaminophen 5-325 MG per tablet  Commonly known  as:  NORCO/VICODIN  Take 1-2 tablets by mouth every 4 (four) hours as needed for moderate pain or severe pain.     metFORMIN 500 MG tablet  Commonly known as:  GLUCOPHAGE  Take 1,000 mg by mouth at bedtime.     ondansetron 4 MG tablet  Commonly known as:  ZOFRAN  Take 1 tablet (4 mg total) by mouth every 6 (six) hours as needed for nausea.             Follow-up Information   Follow up with MURPHY, TIMOTHY, D, MD. Schedule an appointment as soon as possible for a visit in 1 week.   Specialty:  Orthopedic Surgery   Contact information:   [address] [place] [address] [phone]       Follow up with [place] On 05/16/2013. (2:15, For suture removal)    Contact information:   [place] [address] [phone]       Signed: Lisette Abu, PA-C Pager: [phone] General Trauma PA Pager: [phone]  05/10/2013, 3:12 PM

## 2013-05-10 NOTE — Progress Notes (Signed)
Patient ID: Haley Ruiz, female   DOB: 04/01/1950, 63 y.o.   MRN: [id number]   LOS: 2 days   Subjective: Became lightheaded and nauseated while in x-ray this morning, otherwise doing ok.   Objective: Vital signs in last 24 hours: Temp:  [98 F (36.7 C)-99 F (37.2 C)] 98.4 F (36.9 C) (02/12 0900) Pulse Rate:  [79-83] 79 (02/12 0900) Resp:  [16-18] 16 (02/12 0900) BP: (106-137)/(50-60) 107/51 mmHg (02/12 0900) SpO2:  [92 %-100 %] 92 % (02/12 0900) Last BM Date: 05/08/13   Physical Exam General appearance: alert and no distress Resp: clear to auscultation bilaterally Cardio: regular rate and rhythm GI: normal findings: bowel sounds normal and soft, non-tender   Assessment/Plan: Fall down stairs  TBI/Right SAH - Stable Scalp laceration - repaired at [place]  Right clavicle fx - non-op with sling, f/u with Dr. Percell Miller  Right radial head fx -- Sling FEN - on clears will advance after CT scan to soft  VTE - SCD's Dispo -- Can likely d/c home today after therapies as long as episode this morning doesn't persist    Lisette Abu, PA-C Pager: [phone] General Trauma PA Pager: [phone]   05/10/2013

## 2013-05-10 NOTE — Progress Notes (Signed)
Physical Therapy Treatment Patient Details Name: Haley Ruiz MRN: [id number] DOB: 10/08/1950 Today's Date: 05/10/2013 Time: 6256-3893 PT Time Calculation (min): 16 min  PT Assessment / Plan / Recommendation  History of Present Illness This is a pleasant female transferred from [facility] after having fallen down steps. She denied loss of consciousness. She was found they are to have a small right subarachnoid hemorrhage on CT of the head. She was also found to have a right clavicle fracture. She was otherwise without injuries. Her GCS was 15. A scalp laceration was repaired and she was transferred here for neurosurgical evaluation. She denies neck pain, chest pain, or abdominal pain.   PT Comments   Patient did well with ambulation and balance. Will signoff on Acute PT.   Follow Up Recommendations  No PT follow up;Other (comment);Supervision/Assistance - 24 hour     Does the patient have the potential to tolerate intense rehabilitation     Barriers to Discharge        Equipment Recommendations       Recommendations for Other Services    Frequency     Progress towards PT Goals Progress towards PT goals: Goals met/education completed, patient discharged from PT  Plan      Precautions / Restrictions Precautions Precautions: Fall Precaution Comments: reports this to be her only recent fall  Restrictions RUE Weight Bearing: Non weight bearing   Pertinent Vitals/Pain Some pain in R UE. No meds needed. Repositioned sling    Mobility  Bed Mobility Overal bed mobility: Independent Transfers Overall transfer level: Modified independent Ambulation/Gait Ambulation/Gait assistance: Modified independent (Device/Increase time) Ambulation Distance (Feet): 600 Feet Assistive device: None Gait velocity: decreased; guarded due to pain  Stairs assistance: Modified independent (Device/Increase time) Stair Management: One rail Left Number of Stairs: 5    Exercises     PT  Diagnosis:    PT Problem List:   PT Treatment Interventions:     PT Goals (current goals can now be found in the care plan section)    Visit Information  Last PT Received On: 05/10/13 Assistance Needed: +1 Reason Eval/Treat Not Completed: Patient at procedure or test/unavailable History of Present Illness: This is a pleasant female transferred from [facility] after having fallen down steps. She denied loss of consciousness. She was found they are to have a small right subarachnoid hemorrhage on CT of the head. She was also found to have a right clavicle fracture. She was otherwise without injuries. Her GCS was 15. A scalp laceration was repaired and she was transferred here for neurosurgical evaluation. She denies neck pain, chest pain, or abdominal pain.    Subjective Data      Cognition  Cognition Arousal/Alertness: Awake/alert Behavior During Therapy: WFL for tasks assessed/performed Overall Cognitive Status: Within Functional Limits for tasks assessed    Balance  Balance Sitting balance-Leahy Scale: Good Standing balance-Leahy Scale: Good Standardized Balance Assessment Standardized Balance Assessment : Dynamic Gait Index Dynamic Gait Index Level Surface: Normal Change in Gait Speed: Normal Gait with Horizontal Head Turns: Normal Gait with Vertical Head Turns: Normal Gait and Pivot Turn: Normal Step Over Obstacle: Normal Step Around Obstacles: Normal Steps: Mild Impairment Total Score: 23  End of Session PT - End of Session Equipment Utilized During Treatment: Gait belt Activity Tolerance: Patient tolerated treatment well Patient left: in chair;with call bell/phone within reach Nurse Communication: Mobility status   GP     [name] 05/10/2013, 11:56 AM 05/10/2013 [name] PTA [phone] pager  [phone] office     

## 2013-05-10 NOTE — Consult Note (Signed)
Krista Labrandon Knoch Student Nurse [employer]/ Sonja Wilson RN EdD 

## 2013-05-10 NOTE — Progress Notes (Signed)
Occupational Therapy Treatment Patient Details Name: Haley Ruiz MRN: [id number] DOB: February 26, 1951 Today's Date: 05/10/2013 Time: 4132-4401 OT Time Calculation (min): 15 min  OT Assessment / Plan / Recommendation  History of present illness This is a pleasant female transferred from [facility] after having fallen down steps. She denied loss of consciousness. She was found they are to have a small right subarachnoid hemorrhage on CT of the head. She was also found to have a right clavicle fracture. She was otherwise without injuries. Her GCS was 15. A scalp laceration was repaired and she was transferred here for neurosurgical evaluation. She denies neck pain, chest pain, or abdominal pain.   OT comments  Pt demonstrates independence with precautions, she is supervision with sling management and BADLs - will progress quickly to modified independent.  Pt advised to avoid elbow ROM until instructed by MD.    Follow Up Recommendations  Outpatient OT (at MD discretion)    Barriers to Discharge       Equipment Recommendations  None recommended by OT    Recommendations for Other Services    Frequency Min 2X/week   Progress towards OT Goals Progress towards OT goals: Progressing toward goals  Plan Discharge plan needs to be updated    Precautions / Restrictions Precautions Precautions: Fall Precaution Comments: reports this to be her only recent fall  Restrictions Weight Bearing Restrictions: Yes RUE Weight Bearing: Non weight bearing Other Position/Activity Restrictions: no pushing, pulling, lifting   Pertinent Vitals/Pain     ADL  Upper Body Dressing: Minimal assistance Where Assessed - Upper Body Dressing: Unsupported sitting Toilet Transfer: Supervision/safety Toilet Transfer Method: Sit to stand Toilet Transfer Equipment: Regular height toilet Toileting - Clothing Manipulation and Hygiene: Supervision/safety Where Assessed - Best boy and Hygiene:  Standing Tub/Shower Transfer: English as a second language teacher Method: Therapist, art: Walk in Engineer, site Used: Other (comment) (sling) Transfers/Ambulation Related to ADLs: modified independent ADL Comments: Pt able to recall all info provided yesterday re: precautions and sling wear.  Pt able to don/doff sling with supervision.  She is able to simulate UB bathing and dressing  and simulated shower in standing.  Discussed options for tub DME and discussed use of LH sponge vs sitting outside the shower to wash feet if she chooses to stand to shower    OT Diagnosis:    OT Problem List:   OT Treatment Interventions:     OT Goals(current goals can now be found in the care plan section) ADL Goals Pt Will Perform Upper Body Dressing: with modified independence;with caregiver independent in assisting;sitting Pt Will Transfer to Toilet: with modified independence;ambulating;regular height toilet Additional ADL Goal #1: Pt will independently perform HEP for RUE. Additional ADL Goal #2: Pt/caregiver will be independent in performing ADLs while maintaining precautions.   Visit Information  Last OT Received On: 05/10/13 Assistance Needed: +1 History of Present Illness: This is a pleasant female transferred from [facility] after having fallen down steps. She denied loss of consciousness. She was found they are to have a small right subarachnoid hemorrhage on CT of the head. She was also found to have a right clavicle fracture. She was otherwise without injuries. Her GCS was 15. A scalp laceration was repaired and she was transferred here for neurosurgical evaluation. She denies neck pain, chest pain, or abdominal pain.    Subjective Data      Prior Functioning       Cognition  Cognition Arousal/Alertness: Awake/alert Behavior During Therapy: [facility] for  tasks assessed/performed Overall Cognitive Status: Within Functional Limits for tasks assessed     Mobility  Bed Mobility Overal bed mobility: Independent Transfers Overall transfer level: Modified independent Equipment used: None    Exercises  Shoulder Exercises Elbow Flexion:  (Pt instructed no elbow ROM until instructed by MD) Donning/doffing shirt without moving shoulder: Supervision/safety Method for sponge bathing under operated UE: Supervision/safety Donning/doffing sling/immobilizer: Supervision/safety Correct positioning of sling/immobilizer: Supervision/safety Sling wearing schedule (on at all times/off for ADL's): Independent Proper positioning of operated UE when showering: Supervision/safety   Balance Balance Sitting balance-Leahy Scale: Normal Standing balance-Leahy Scale: Good Standardized Balance Assessment Standardized Balance Assessment : Dynamic Gait Index Dynamic Gait Index Level Surface: Normal Change in Gait Speed: Normal Gait with Horizontal Head Turns: Normal Gait with Vertical Head Turns: Normal Gait and Pivot Turn: Normal Step Over Obstacle: Normal Step Around Obstacles: Normal Steps: Mild Impairment Total Score: 23  End of Session OT - End of Session Equipment Utilized During Treatment: Other (comment) (sling)  GO     Annis Lagoy M 05/10/2013, 12:43 PM

## 2013-05-11 NOTE — Progress Notes (Signed)
Agree with PTA. Pt functioning at baseline. [place] for Sign off from acute PT standpoint at this time. [place], [place], [place]

## 2013-05-16 ENCOUNTER — Ambulatory Visit (INDEPENDENT_AMBULATORY_CARE_PROVIDER_SITE_OTHER): Payer: BC Managed Care – PPO | Admitting: General Surgery

## 2013-05-16 ENCOUNTER — Encounter (INDEPENDENT_AMBULATORY_CARE_PROVIDER_SITE_OTHER): Payer: Self-pay

## 2013-05-16 VITALS — BP 124/58 | HR 76 | Temp 98.5°F | Resp 14 | Ht 64.0 in | Wt 170.8 lb

## 2013-05-16 DIAGNOSIS — S0101XA Laceration without foreign body of scalp, initial encounter: Secondary | ICD-10-CM

## 2013-05-16 DIAGNOSIS — S0100XA Unspecified open wound of scalp, initial encounter: Secondary | ICD-10-CM

## 2013-05-16 NOTE — Patient Instructions (Signed)
Follow up with your primary care doctor.  Follow up with [place] surgery if needed.

## 2013-05-16 NOTE — Progress Notes (Signed)
Subjective: suture removal     Patient ID: Haley Ruiz, female   DOB: March 13, 1951, 63 y.o.   MRN: [id number]  HPI Haley Ruiz presents today for stitches removal.  She was admitted under trauma service following a fall down the stairs.  She reports feeling well overall.  She has occasional episodes of "motion sickness"  She denies further falls, LOC.  She has an appointment with Dr. Percell Miller.  No other concerns or complaints.    Review of Systems  Constitutional: Negative.   HENT: Negative.   Respiratory: Negative.   Cardiovascular: Negative.   Neurological: Negative.        Objective:   Physical Exam  Nursing note and vitals reviewed. Constitutional: She appears well-developed and well-nourished. No distress.  HENT:  Head: Normocephalic.  Healing right scalp laceration-sutures were removed without any issues.    Neck: Normal range of motion. Neck supple.  Skin: Skin is warm and dry. No rash noted. She is not diaphoretic. No erythema. No pallor.  Psychiatric: She has a normal mood and affect. Her behavior is normal. Judgment and thought content normal.       Assessment:     Scalp laceration Removal of stitches     Plan:     She may leave the laceration open to air, may apply anti scar cream.  She was advised to follow up with her primary care doctor. She has a follow up with orthopedics.  Follow up with trauma as needed.

## 2013-11-19 ENCOUNTER — Other Ambulatory Visit: Payer: Self-pay | Admitting: Orthopedic Surgery

## 2013-11-19 DIAGNOSIS — M25511 Pain in right shoulder: Secondary | ICD-10-CM

## 2013-11-24 ENCOUNTER — Ambulatory Visit
Admission: RE | Admit: 2013-11-24 | Discharge: 2013-11-24 | Disposition: A | Discharge status: Home / Self Care | Payer: BC Managed Care – PPO | Source: Ambulatory Visit | Attending: Orthopedic Surgery | Admitting: Orthopedic Surgery

## 2013-11-24 DIAGNOSIS — M25511 Pain in right shoulder: Secondary | ICD-10-CM

## 2015-05-15 ENCOUNTER — Other Ambulatory Visit: Payer: Self-pay | Admitting: Internal Medicine

## 2015-05-15 DIAGNOSIS — R310 Gross hematuria: Secondary | ICD-10-CM

## 2015-05-22 ENCOUNTER — Ambulatory Visit
Admission: RE | Admit: 2015-05-22 | Discharge: 2015-05-22 | Disposition: A | Discharge status: Home / Self Care | Payer: BC Managed Care – PPO | Source: Ambulatory Visit | Attending: Internal Medicine | Admitting: Internal Medicine

## 2015-05-22 DIAGNOSIS — R310 Gross hematuria: Secondary | ICD-10-CM | POA: Diagnosis not present

## 2016-03-01 ENCOUNTER — Emergency Department: Payer: BC Managed Care – PPO

## 2016-03-01 ENCOUNTER — Emergency Department
Admission: EM | Admit: 2016-03-01 | Discharge: 2016-03-01 | Disposition: A | Discharge status: Home / Self Care | Payer: BC Managed Care – PPO | Attending: Emergency Medicine | Admitting: Emergency Medicine

## 2016-03-01 ENCOUNTER — Encounter: Payer: Self-pay | Admitting: Emergency Medicine

## 2016-03-01 DIAGNOSIS — M25472 Effusion, left ankle: Secondary | ICD-10-CM | POA: Diagnosis present

## 2016-03-01 DIAGNOSIS — R600 Localized edema: Secondary | ICD-10-CM | POA: Insufficient documentation

## 2016-03-01 DIAGNOSIS — E119 Type 2 diabetes mellitus without complications: Secondary | ICD-10-CM | POA: Diagnosis not present

## 2016-03-01 DIAGNOSIS — Z7984 Long term (current) use of oral hypoglycemic drugs: Secondary | ICD-10-CM | POA: Diagnosis not present

## 2016-03-01 DIAGNOSIS — R609 Edema, unspecified: Secondary | ICD-10-CM

## 2016-03-01 LAB — CBC WITH DIFFERENTIAL/PLATELET
BASOS PCT: 1 %
Basophils Absolute: 0 10*3/uL (ref 0–0.1)
EOS ABS: 0.1 10*3/uL (ref 0–0.7)
Eosinophils Relative: 2 %
HCT: 40.1 % (ref 35.0–47.0)
HEMOGLOBIN: 13.6 g/dL (ref 12.0–16.0)
Lymphocytes Relative: 38 %
Lymphs Abs: 2.9 10*3/uL (ref 1.0–3.6)
MCH: 29.6 pg (ref 26.0–34.0)
MCHC: 33.9 g/dL (ref 32.0–36.0)
MCV: 87.2 fL (ref 80.0–100.0)
MONOS PCT: 8 %
Monocytes Absolute: 0.6 10*3/uL (ref 0.2–0.9)
NEUTROS PCT: 51 %
Neutro Abs: 3.9 10*3/uL (ref 1.4–6.5)
Platelets: 339 10*3/uL (ref 150–440)
RBC: 4.6 MIL/uL (ref 3.80–5.20)
RDW: 14.1 % (ref 11.5–14.5)
WBC: 7.4 10*3/uL (ref 3.6–11.0)

## 2016-03-01 LAB — BASIC METABOLIC PANEL
Anion gap: 8 (ref 5–15)
BUN: 13 mg/dL (ref 6–20)
CALCIUM: 9.2 mg/dL (ref 8.9–10.3)
CHLORIDE: 104 mmol/L (ref 101–111)
CO2: 26 mmol/L (ref 22–32)
CREATININE: 0.8 mg/dL (ref 0.44–1.00)
Glucose, Bld: 114 mg/dL — ABNORMAL HIGH (ref 65–99)
Potassium: 3.7 mmol/L (ref 3.5–5.1)
SODIUM: 138 mmol/L (ref 135–145)

## 2016-03-01 LAB — CK: CK TOTAL: 74 U/L (ref 38–234)

## 2016-03-01 MED ORDER — ASPIRIN 81 MG PO CHEW
324.0000 mg | CHEWABLE_TABLET | Freq: Once | ORAL | Status: AC
  Administered 2016-03-01: 324 mg via ORAL
  Filled 2016-03-01: qty 4

## 2016-03-01 MED ORDER — ASCRIPTIN 325 MG PO TABS: 1.0000 | ORAL_TABLET | Freq: Every day | ORAL | 0 refills | Status: AC

## 2016-03-01 NOTE — ED Triage Notes (Signed)
Pt presents to ED triage via wheelchair with c/o left leg pain from knee down to foot for a week and swelling in leg began Saturday. Pt denies known injury to leg. Swelling noted to left leg. Pt denies chest pain, shortness of breath or other complaints. (+) pedal pulse, foot warm to touch.

## 2016-03-01 NOTE — ED Provider Notes (Signed)
[facility] Emergency Department Provider Note  ____________________________________________   None    (approximate)  I have reviewed the triage vital signs and the nursing notes.   HISTORY  Chief Complaint Leg Pain   HPI Haley Ruiz is a 65 y.o. female with a history of diabetes as well as hypercholesterolemia who is presenting to the emergency department with 1 week of worsening left ankle swelling. She says that she thinks she pulled something in her left calf about a week ago coming down the stairs. Her symptoms that she is a cramping and fullness to the left calf. She has noted left ankle and foot swelling as well which is increased.   Past Medical History:  Diagnosis Date  . Diabetes mellitus without complication ([place])   . Diverticulitis   . High cholesterol   . [facility])     Patient Active Problem List   Diagnosis Date Noted  . Fall 05/10/2013  . Right clavicle fracture 05/10/2013  . Right radial head fracture 05/10/2013  . Scalp laceration 05/10/2013  . Diverticular disease 05/10/2013  . DM (diabetes mellitus) ([place]) 05/10/2013  . Hyperlipidemia 05/10/2013  . TBI (traumatic brain injury) ([place]) 05/08/2013    Past Surgical History:  Procedure Laterality Date  . ABDOMINAL HYSTERECTOMY    . SHOULDER SURGERY      Prior to Admission medications   Medication Sig Start Date End Date Taking? Authorizing Provider  atorvastatin (LIPITOR) 10 MG tablet Take 10 mg by mouth daily.    Historical Provider, MD  HYDROcodone-acetaminophen (NORCO/VICODIN) 5-325 MG per tablet Take 1-2 tablets by mouth every 4 (four) hours as needed for moderate pain or severe pain. 05/10/13   Lisette Abu, PA-C  metFORMIN (GLUCOPHAGE) 500 MG tablet Take 1,000 mg by mouth at bedtime.     Historical Provider, MD  ondansetron (ZOFRAN) 4 MG tablet Take 1 tablet (4 mg total) by mouth every 6 (six) hours as needed for nausea. 05/10/13   Lisette Abu, PA-C     Allergies Patient has no known allergies.  Family History  Problem Relation Age of Onset  . Cancer Mother     lymphoma  . Heart disease Father     Social History Social History  Substance Use Topics  . Smoking status: Never Smoker  . Smokeless tobacco: Never Used  . Alcohol use No    Review of Systems Constitutional: No fever/chills Eyes: No visual changes. ENT: No sore throat. Cardiovascular: Denies chest pain. Respiratory: Denies shortness of breath. Gastrointestinal: No abdominal pain.  No nausea, no vomiting.  No diarrhea.  No constipation. Genitourinary: Negative for dysuria. Musculoskeletal: Negative for back pain. Skin: Negative for rash. Neurological: Negative for headaches, focal weakness or numbness.  10-point ROS otherwise negative.  ____________________________________________   PHYSICAL EXAM:  VITAL SIGNS: ED Triage Vitals [03/01/16 1845]  Enc Vitals Group     BP 137/76     Pulse Rate 72     Resp 18     Temp 98.6 F (37 C)     Temp Source Oral     SpO2 100 %     Weight 170 lb (77.1 kg)     Height 5\' 5"  (1.651 m)     Head Circumference      Peak Flow      Pain Score 2     Pain Loc      Pain Edu?      Excl. in [place]?     Constitutional: Alert  and oriented. Well appearing and in no acute distress. Eyes: Conjunctivae are normal. PERRL. EOMI. Head: Atraumatic. Nose: No congestion/rhinnorhea. Mouth/Throat: Mucous membranes are moist.   Neck: No stridor.   Cardiovascular: Normal rate, regular rhythm. Grossly normal heart sounds.  Good peripheral circulation With equal and bilateral dorsalis pedis pulses. Respiratory: Normal respiratory effort.  No retractions. Lungs CTAB. Gastrointestinal: Soft and nontender. No distention.  Musculoskeletal: Mild tenderness over the posterior left calf as well as mild to moderate edema over the left lateral malleolus. No redness, induration or pus. Neurologic:  Normal speech and language. No gross focal  neurologic deficits are appreciated.  Skin:  Skin is warm, dry and intact. No rash noted. Psychiatric: Mood and affect are normal. Speech and behavior are normal.  ____________________________________________   LABS (all labs ordered are listed, but only abnormal results are displayed)  Labs Reviewed  BASIC METABOLIC PANEL - Abnormal; Notable for the following:       Result Value   Glucose, Bld 114 (*)    All other components within normal limits  CBC WITH DIFFERENTIAL/PLATELET  CK   ____________________________________________  EKG   ____________________________________________  RADIOLOGY  US Venous Img Lower Unilateral Left (Final result)  Result time 03/01/16 19:52:30  Final result by Massie Kluver, MD (03/01/16 19:52:30)           Narrative:   CLINICAL DATA: Left lower extremity pain and swelling x1 week.  EXAM: LEFT LOWER EXTREMITY VENOUS DOPPLER ULTRASOUND  TECHNIQUE: Gray-scale sonography with graded compression, as well as color Doppler and duplex ultrasound were performed to evaluate the lower extremity deep venous systems from the level of the common femoral vein and including the common femoral, femoral, profunda femoral, popliteal and calf veins including the posterior tibial, peroneal and gastrocnemius veins when visible. The superficial great saphenous vein was also interrogated. Spectral Doppler was utilized to evaluate flow at rest and with distal augmentation maneuvers in the common femoral, femoral and popliteal veins.  COMPARISON: None.  FINDINGS: Contralateral Common Femoral Vein: Respiratory phasicity is normal and symmetric with the symptomatic side. No evidence of thrombus. Normal compressibility.  Common Femoral Vein: No evidence of thrombus. Normal compressibility, respiratory phasicity and response to augmentation and demonstrated response to Valsalva.  Saphenofemoral Junction: No evidence of thrombus. Normal compressibility  and flow on color Doppler imaging.  Profunda Femoral Vein: No evidence of thrombus. Normal compressibility and flow on color Doppler imaging.  Femoral Vein: No evidence of thrombus. Normal compressibility, respiratory phasicity and response to augmentation.  Popliteal Vein: No evidence of thrombus. Normal compressibility, respiratory phasicity and response to augmentation.  Calf Veins: No evidence of thrombus. Normal compressibility and flow on color Doppler imaging.  Superficial Great Saphenous Vein: No evidence of thrombus. Normal compressibility and flow on color Doppler imaging.  Venous Reflux: Not elicited  Other Findings: None.  IMPRESSION: No evidence of left lower extremity deep venous thrombosis.   Electronically Signed By: Ashley Royalty M.D. On: 03/01/2016 19:52            ____________________________________________   PROCEDURES  Procedure(s) performed:   Procedures  Critical Care performed:   ____________________________________________   INITIAL IMPRESSION / ASSESSMENT AND PLAN / ED COURSE  Pertinent labs & imaging results that were available during my care of the patient were reviewed by me and considered in my medical decision making (see chart for details).   Clinical Course   ----------------------------------------- 9:18 PM on 03/01/2016 -----------------------------------------  Patient resting comfortably at this time. Very reassuring ON  as well as lab results. Advised patient Ace wrap, which she has a home, as well as keep the limb elevated.  Patient does report a trip to the beach and back which was only 3 hours and she came back today. Lower suspicion for DVT but will place the patient on aspirin, 324 mg, for one week. She says that she'll be able to follow up with her primary care doctor later this week for repeat ultrasound to see if any clot is developing.   She is understanding of this plan and willing to comply. will be  discharged.Possibly edema related to lower extremity injury.   ____________________________________________   FINAL CLINICAL IMPRESSION(S) / ED DIAGNOSES  Peripheral edema.     NEW MEDICATIONS STARTED DURING THIS VISIT:  New Prescriptions   No medications on file     Note:  This document was prepared using Dragon voice recognition software and may include unintentional dictation errors.    Orbie Pyo, MD 03/01/16 [id number]

## 2016-03-22 IMAGING — US US RENAL
1 series · 14 of 25 positions shown · non-contrast
Comparison: CT abdomen pelvis of 01/05/2010

CLINICAL DATA: Gross hematuria, frequent urinary tract infections
over the last 10 years

EXAM:
RENAL / URINARY TRACT ULTRASOUND COMPLETE

[Series 1: us renal · 0.26mm/px · 14 of 27 slices shown]
[im 1/27]
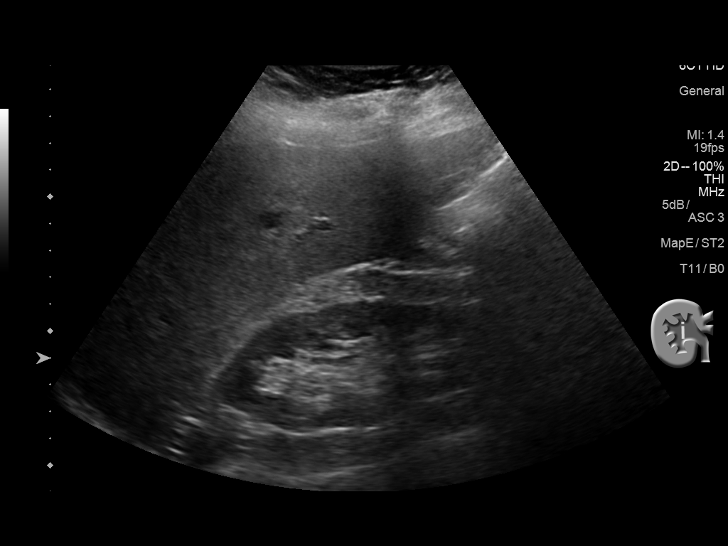
[im 3/27]
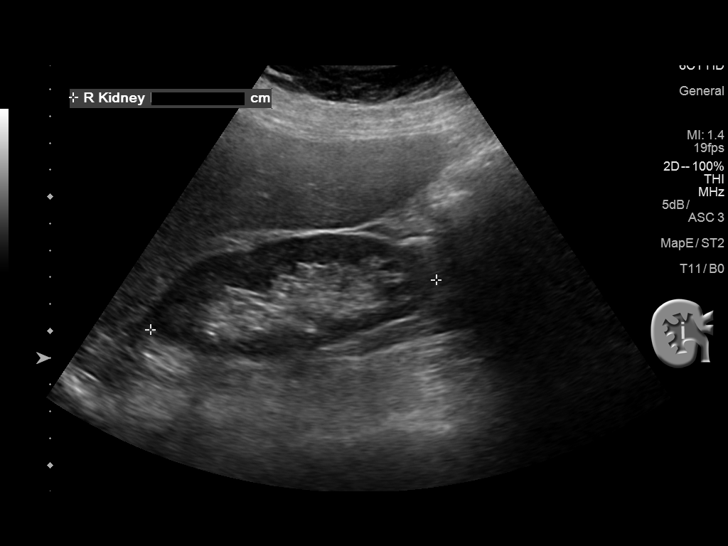
[im 5/27]
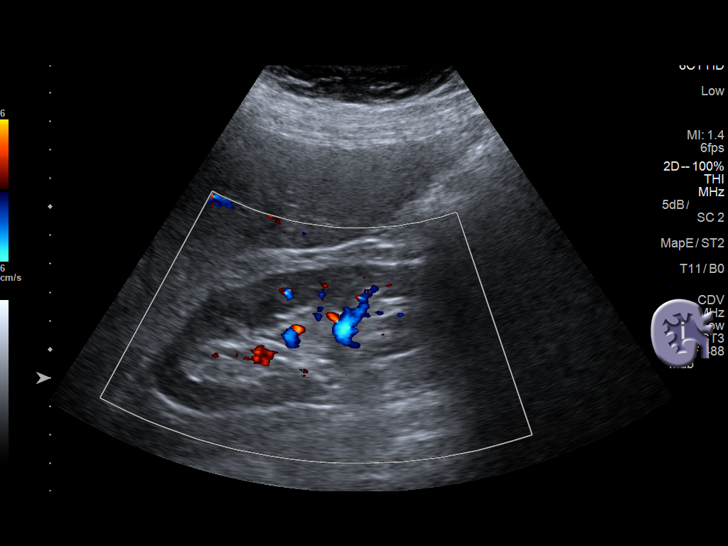
[im 7/27]
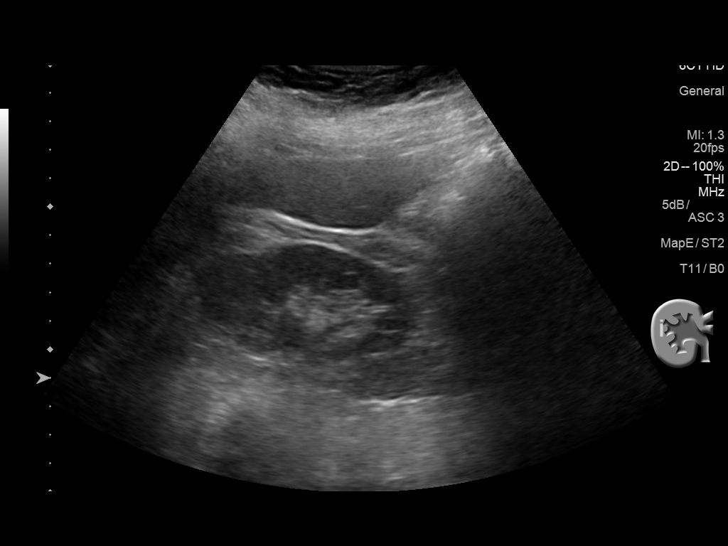
[im 9/27]
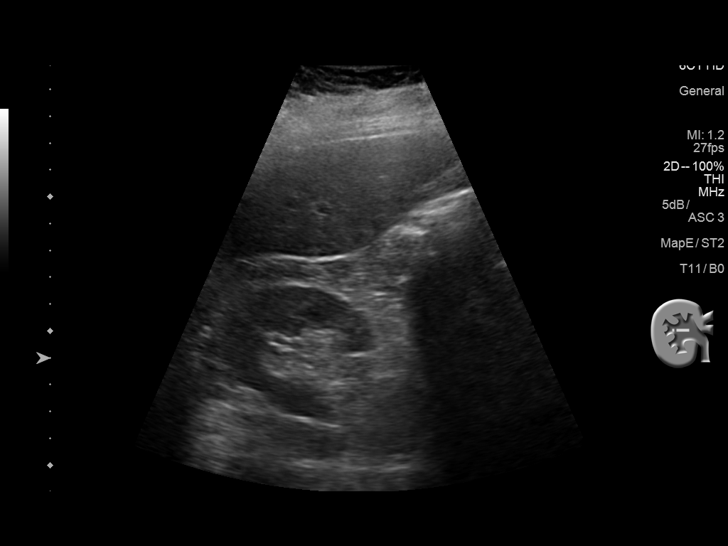
[im 10/27]
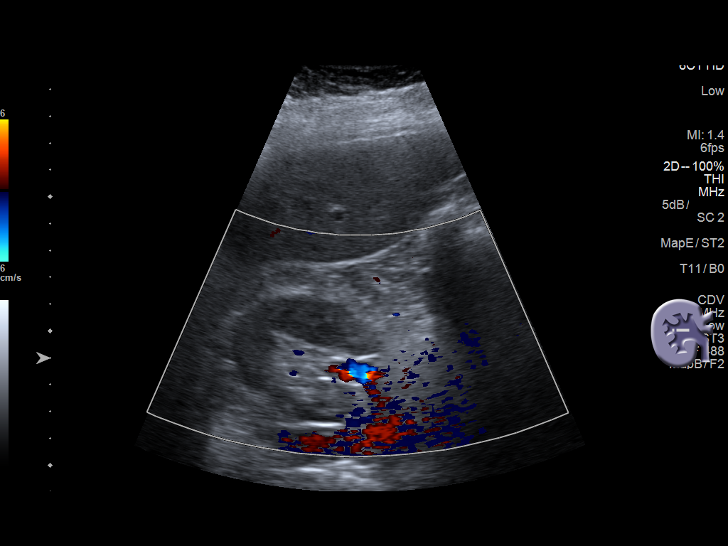
[im 12/27]
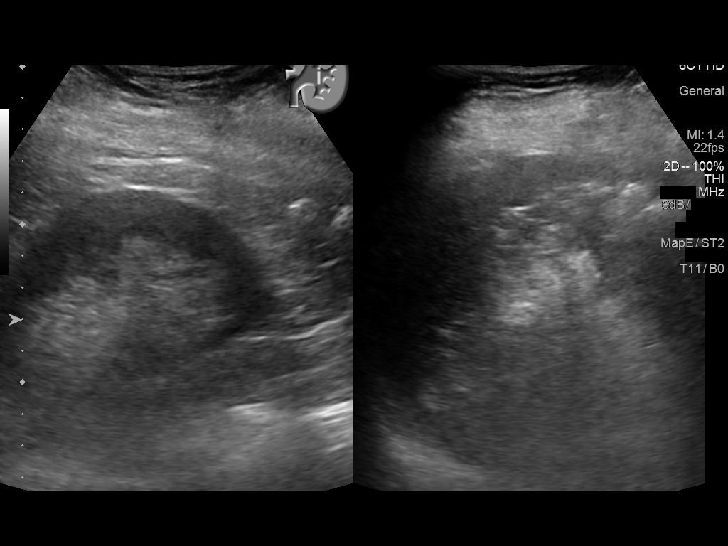
[im 15/27]
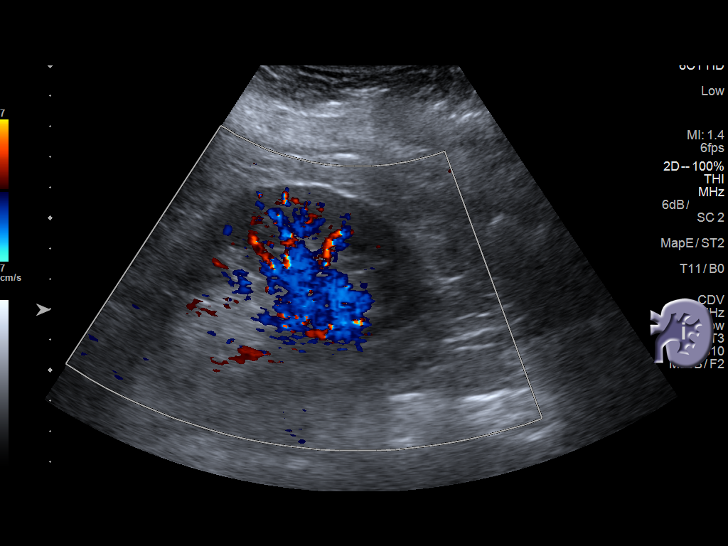
[im 17/27]
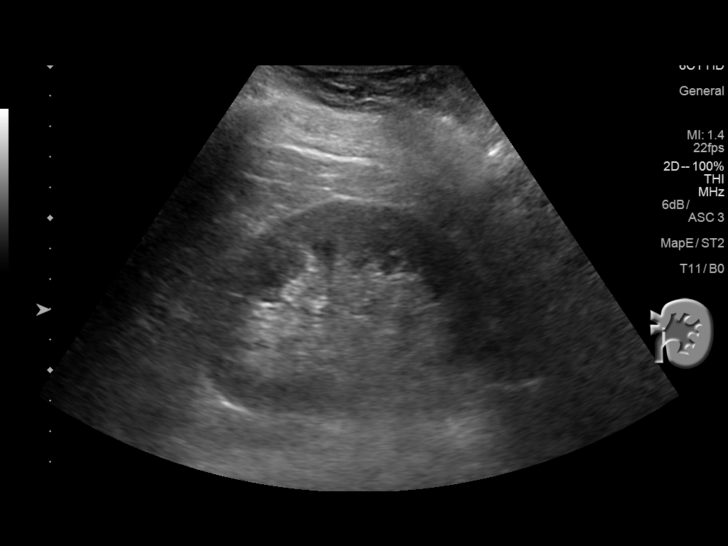
[im 18/27]
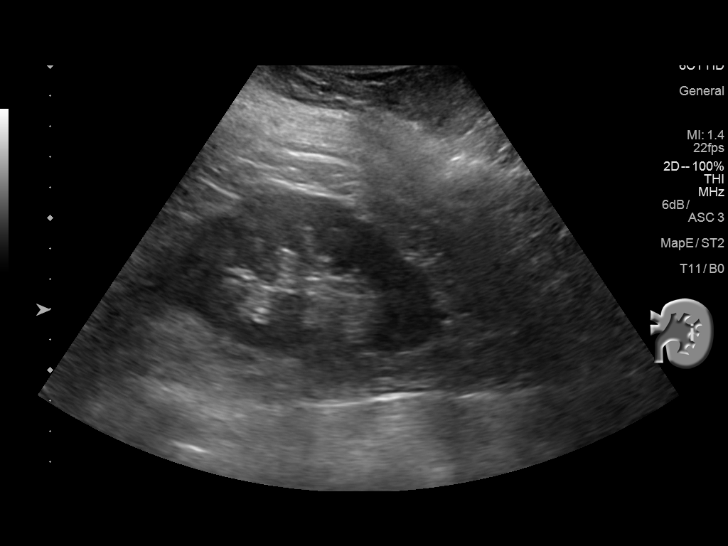
[im 20/27]
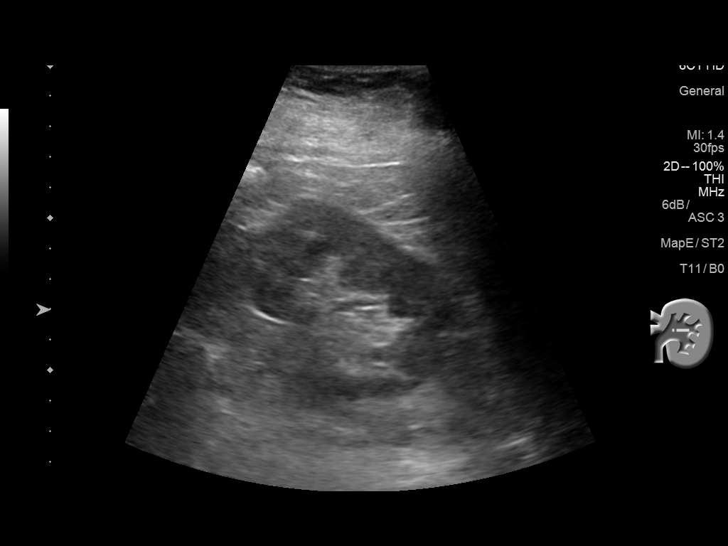
[im 22/27]
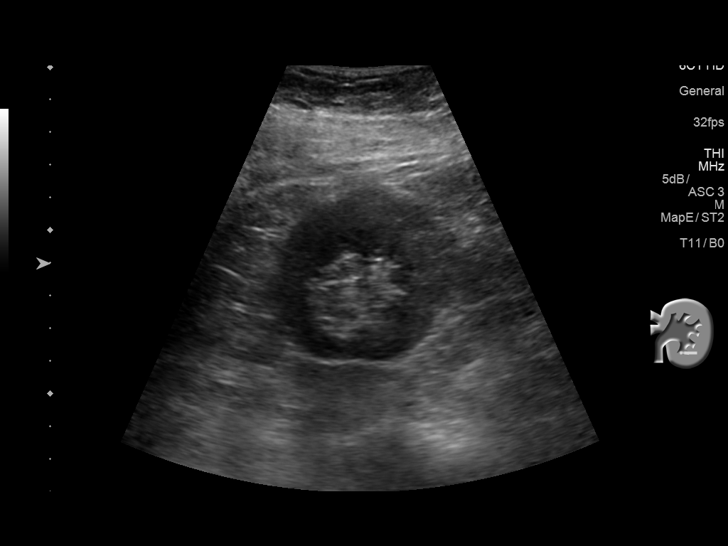
[im 24/27]
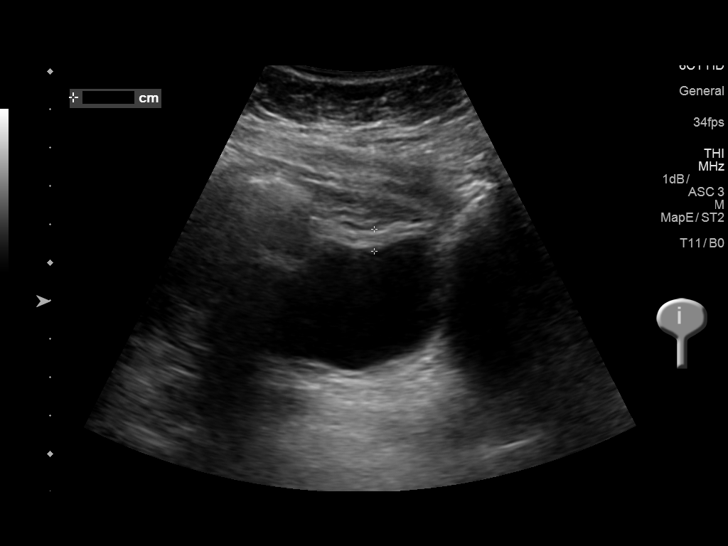
[im 27/27]
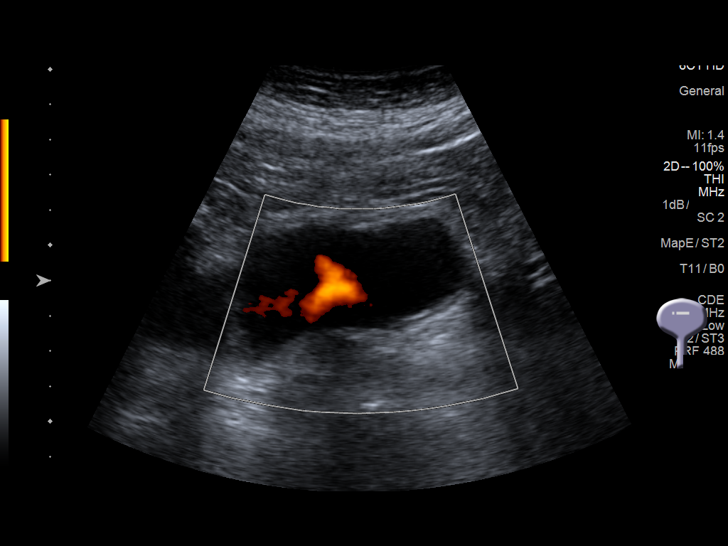

[14 of 25 positions shown; findings below may reference images not displayed]

FINDINGS: Right Kidney:

Length: 10.8 cm.. No hydronephrosis is seen. The echogenicity of the
renal parenchyma is within normal limits.

Left Kidney:

Length: 10.2 cm.. No hydronephrosis is noted. The echogenicity of
the renal parenchyma is normal.

Bladder:

The urinary bladder is unremarkable not being well distended.
Bilateral ureteral jets are present.
IMPRESSION: 1. No hydronephrosis.
2. Bilateral ureteral jets are noted. The urinary bladder is not
well distended.

## 2016-12-31 IMAGING — US US EXTREM LOW VENOUS*L*
1 series · 13 of 24 positions shown · non-contrast
Comparison: None.

CLINICAL DATA: Left lower extremity pain and swelling x1 week.



[Series 1: us extrem low venous*left* · 0.08mm/px · 13 of 33 slices shown]
[im 1/33]
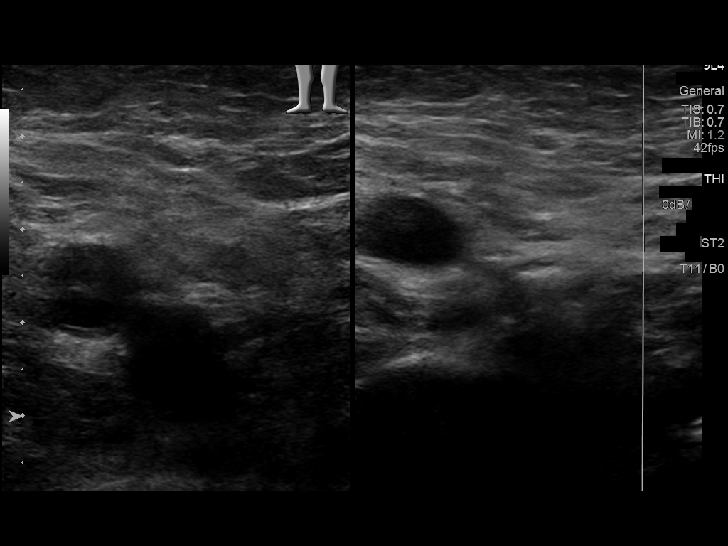
[im 3/33]
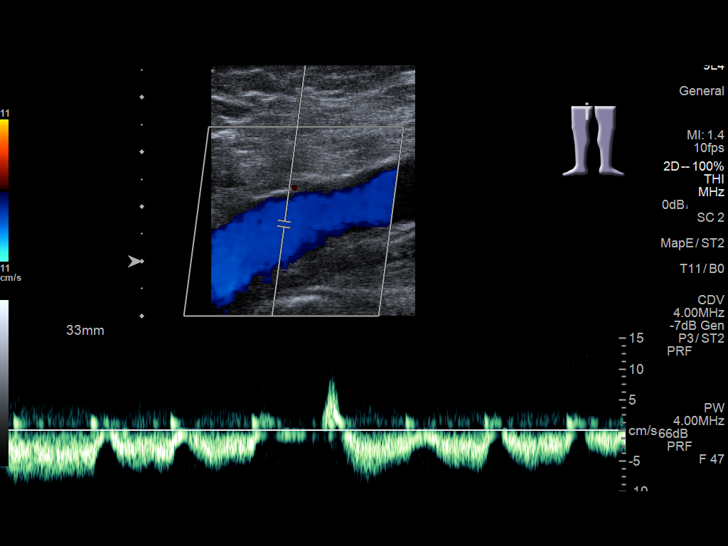
[im 6/33]
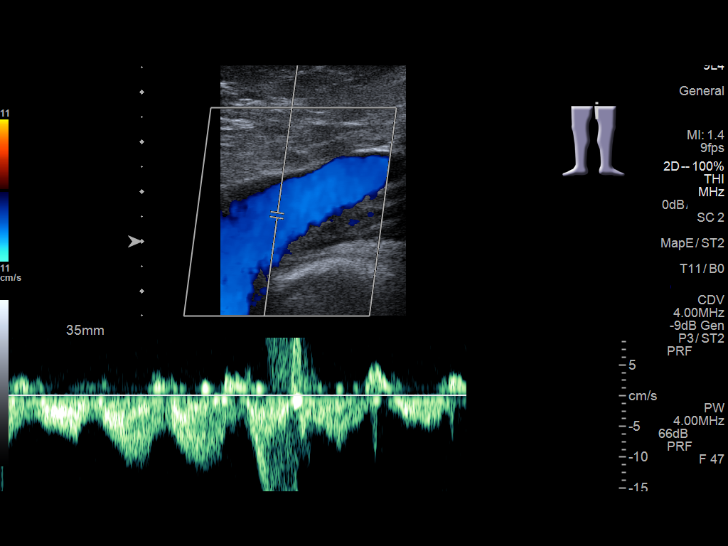
[im 9/33]
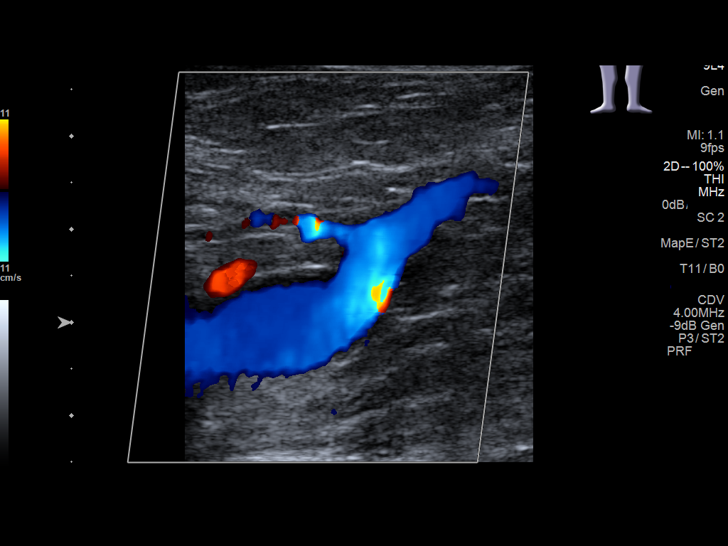
[im 12/33]
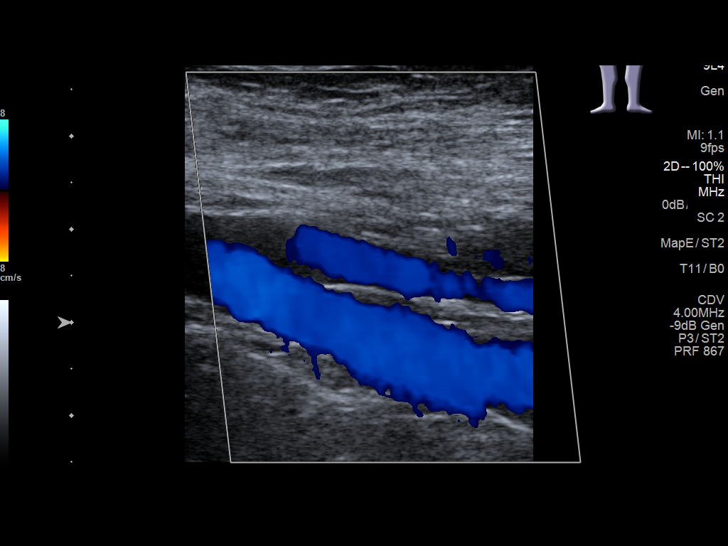
[im 14/33]
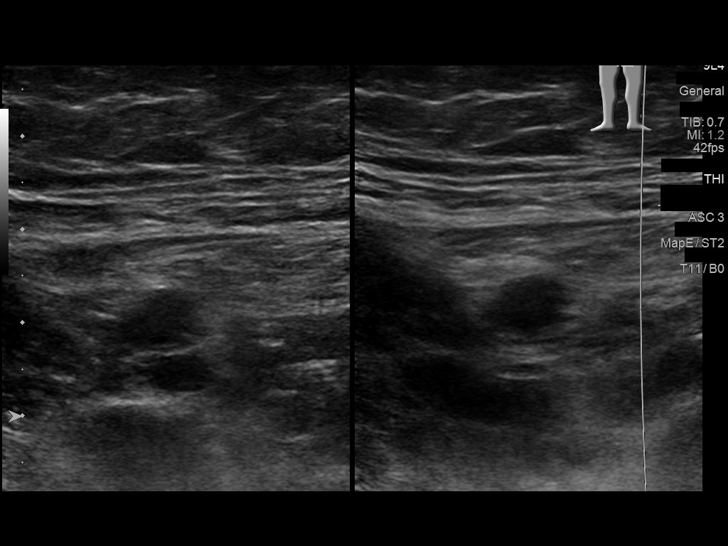
[im 17/33]
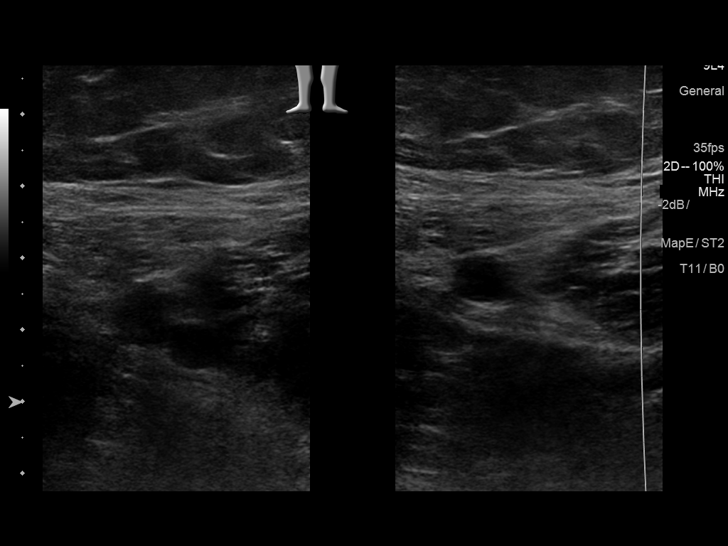
[im 19/33]
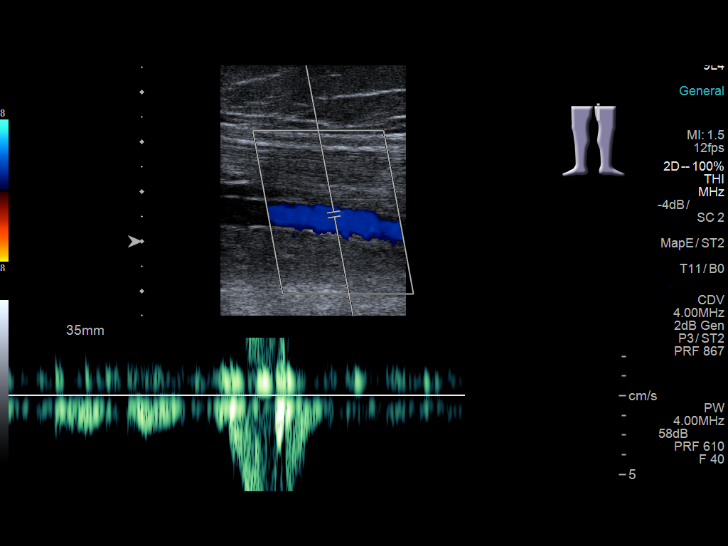
[im 21/33]
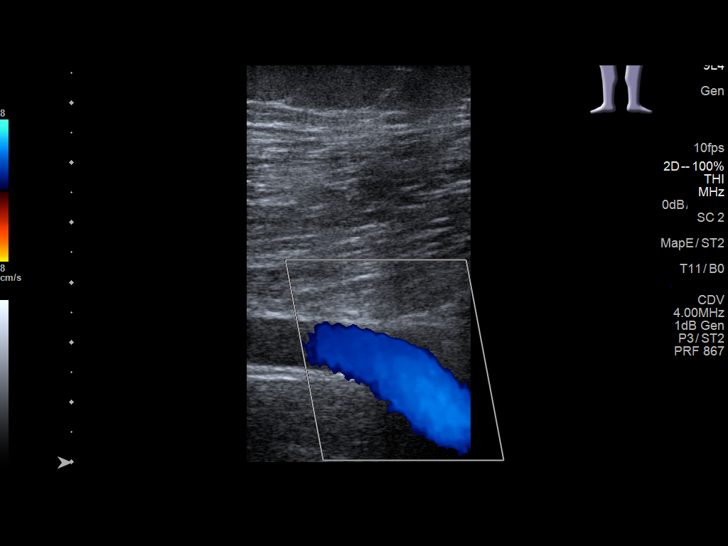
[im 24/33]
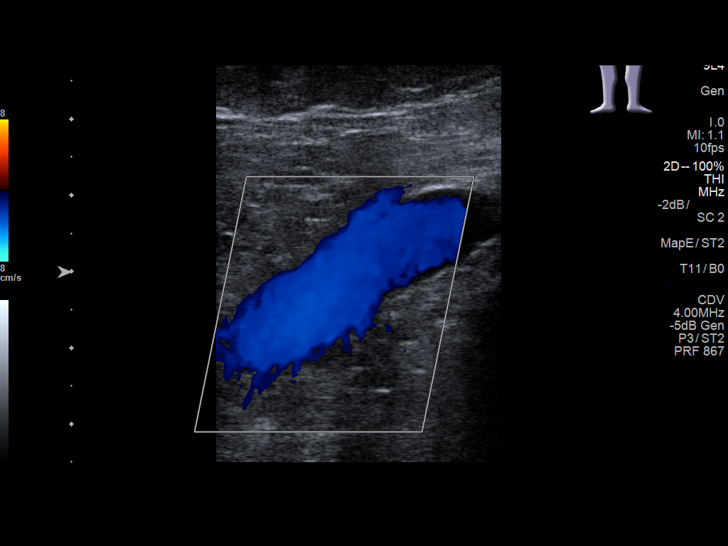
[im 27/33]
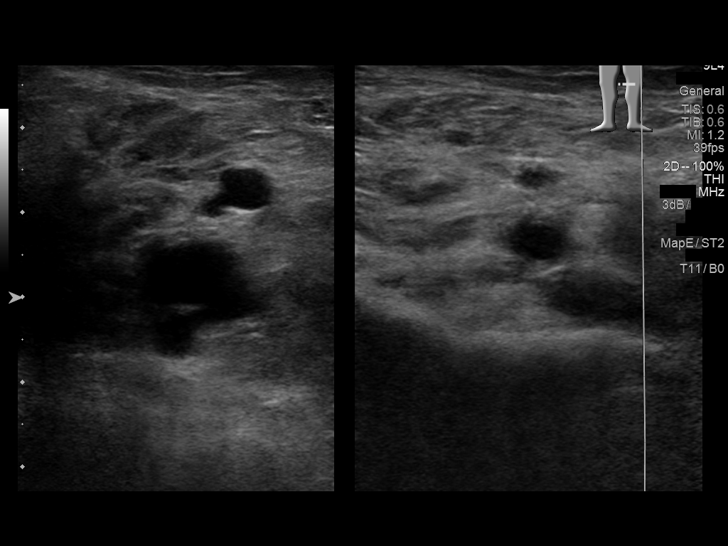
[im 30/33]
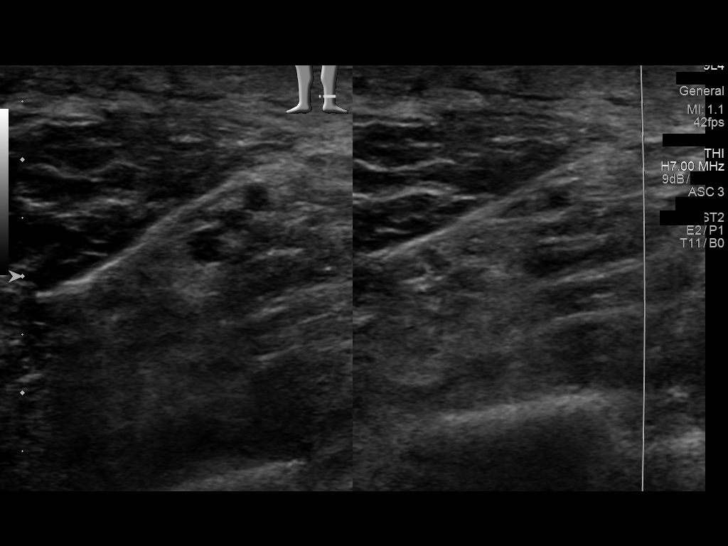
[im 33/33]
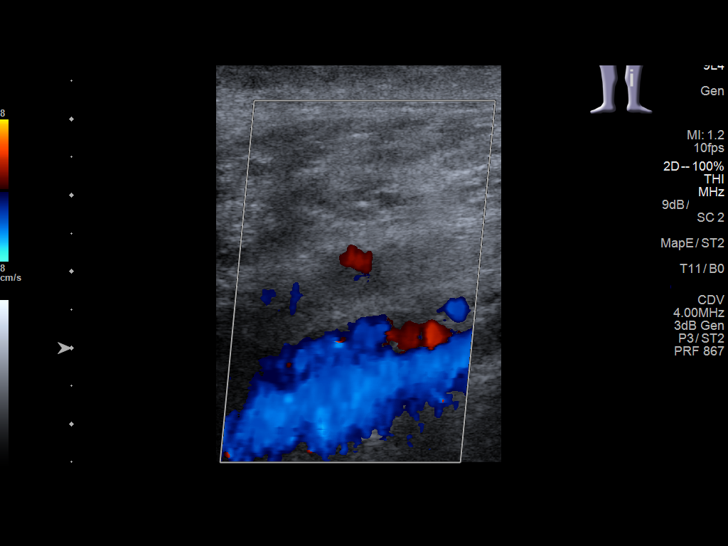

[13 of 24 positions shown; findings below may reference images not displayed]

FINDINGS: Contralateral Common Femoral Vein: Respiratory phasicity is normal
and symmetric with the symptomatic side. No evidence of thrombus.
Normal compressibility.

Common Femoral Vein: No evidence of thrombus. Normal
compressibility, respiratory phasicity and response to augmentation
and demonstrated response to Valsalva.

Saphenofemoral Junction: No evidence of thrombus. Normal
compressibility and flow on color Doppler imaging.

Profunda Femoral Vein: No evidence of thrombus. Normal
compressibility and flow on color Doppler imaging.

Femoral Vein: No evidence of thrombus. Normal compressibility,
respiratory phasicity and response to augmentation.

Popliteal Vein: No evidence of thrombus. Normal compressibility,
respiratory phasicity and response to augmentation.

Calf Veins: No evidence of thrombus. Normal compressibility and flow
on color Doppler imaging.

Superficial Great Saphenous Vein: No evidence of thrombus. Normal
compressibility and flow on color Doppler imaging.

Venous Reflux:  Not elicited

Other Findings:  None.
IMPRESSION: No evidence of left lower extremity deep venous thrombosis.

## 2017-06-16 ENCOUNTER — Other Ambulatory Visit: Payer: Self-pay | Admitting: Internal Medicine

## 2017-06-16 DIAGNOSIS — Z1239 Encounter for other screening for malignant neoplasm of breast: Secondary | ICD-10-CM

## 2019-04-26 ENCOUNTER — Ambulatory Visit: Payer: BC Managed Care – PPO

## 2019-05-04 ENCOUNTER — Ambulatory Visit: Payer: BC Managed Care – PPO

## 2019-05-17 ENCOUNTER — Ambulatory Visit: Payer: BC Managed Care – PPO

## 2020-07-30 ENCOUNTER — Ambulatory Visit: Payer: Medicare PPO | Attending: Obstetrics and Gynecology | Admitting: Physical Therapy

## 2020-07-30 ENCOUNTER — Other Ambulatory Visit: Payer: Self-pay

## 2020-07-30 ENCOUNTER — Encounter: Payer: Self-pay | Admitting: Physical Therapy

## 2020-07-30 DIAGNOSIS — M6281 Muscle weakness (generalized): Secondary | ICD-10-CM | POA: Insufficient documentation

## 2020-07-30 DIAGNOSIS — R278 Other lack of coordination: Secondary | ICD-10-CM | POA: Insufficient documentation

## 2020-07-30 NOTE — Therapy (Signed)
[facility] [address] [place], [place], [address] Phone: [phone]   Fax:  [phone]  Physical Therapy Evaluation  Patient Details  Name: LASHUNDA GREIS MRN: [id number] Date of Birth: [birth year] Referring Provider (PT): Annette Stable   Encounter Date: 07/30/2020   PT End of Session - 07/30/20 0927    Visit Number 1    Number of Visits 12    Date for PT Re-Evaluation 10/22/20    PT Start Time 0930    PT Stop Time 1010    PT Time Calculation (min) 40 min    Activity Tolerance Patient tolerated treatment well    Behavior During Therapy [facility] for tasks assessed/performed           Past Medical History:  Diagnosis Date  . Diabetes mellitus without complication ([place])   . Diverticulitis   . High cholesterol   . Melanoma [facility])     Past Surgical History:  Procedure Laterality Date  . ABDOMINAL HYSTERECTOMY    . INCONTINENCE SURGERY    . SACROSPINOUS LIGAMENT FIXATION    . SHOULDER SURGERY      There were no vitals filed for this visit.        [facility] PT Assessment - 07/30/20 9179      Assessment   Medical Diagnosis SUI, UUI, chronic constipation    Referring Provider (PT) Philis Fendt, Reather Laurence    Onset Date/Surgical Date 07/31/19    Hand Dominance Right    Prior Therapy None for this dx      Balance Screen   Has the patient fallen in the past 6 months No           [facility] PHYSICAL THERAPY EVALUATION  SCREENING Red Flags: None Have you had any night sweats? Unexplained weight loss? Saddle anesthesia? Unexplained changes in bowel or bladder habits?  SUBJECTIVE  Chief Complaint: Patient notes that after surgical repair of prolapse she had increased bladder leakege. Patient has switched medications in the past year which has also ipacted function. Patient continues to have bladder leakage with stress components and occasional urge components. She notes UI is improved, but still  bothersome. Patient lives alone and is not sexually active. Patient widowed in 2019.   Pertinent History:  Falls Negative.  Scoliosis Negative. Pulmonary disease/dysfunction Negative. Surgical history: Positive for see above.   Obstetrical History: G3P3 Deliveries: vaginal Tearing/Episiotomy: episiotomy x3 Birthing position: back  Gynecological History: Hysterectomy: Yes Abdominal  Pelvic Organ Prolapse: Positive for surgical repair Pain with exam: Yes (treated with silver nitrate 1 week ago and to be rechecked in 1 month) Heaviness/pressure: No    Urinary History: Incontinence: Positive. Onset: 2019 Triggers: coughing, laughing, sneezing, stair climbing, sudden movement, standing (30%); urge with prolonged storage and limited bathroom access  Amount: Min/Mod.  Protective undergarments: Yes  Type: depends  Number used/day: 1 Fluid Intake: 40 oz H20, 2 cups coffee caffeinated, occasional gatorade zero Nocturia: 0-1x/night  Frequency of urination: every 2 hours Pain with urination: Negative Difficulty initiating urination: Negative Intermittent stream: Negative Frequent UTI: Positive for prior to surgery.   Gastrointestinal History: [place] Stool Chart: Type 1-2 (without stool softener) and Type 4-5 with stool softener (Colace) Frequency of BMs: 3x/week Pain with defecation: Positive for occasionally Straining with defecation: Positive for 60% Hemorrhoids: Positive ; internal/external; monitored by GI Toileting posture: feet flat Incontinence: Negative.   Sexual activity/pain: Pain with intercourse: Negative.   Initial penetration: No  Deep thrusting: No  Location  of pain: not applicable  Patient assessment of present state: "I have a leaky bladder"  Current activities:  Boating, walking, dog, social activities  Patient Goals:  "It would be nice to have it not leak as frequently." "I would love to not be incontinent."   OBJECTIVE  Mental Status Patient is  oriented to person, place and time.  Recent memory is intact.  Remote memory is intact.  Attention span and concentration are intact.  Expressive speech is intact.  Patient's fund of knowledge is within normal limits for educational level.  POSTURE/OBSERVATIONS:  Lumbar lordosis: WNL Thoracic kyphosis: WNL Seated posture with increased hip adduction and posterior pelvic tilt.   GAIT: Grossly WFL  RANGE OF MOTION: deferred 2/2 to time constraints   LEFT RIGHT  Lumbar forward flexion (65):      Lumbar extension (30):     Lumbar lateral flexion (25):     Thoracic and Lumbar rotation (30 degrees):       Hip Flexion (0-125):      Hip IR (0-45):     Hip ER (0-45):     Hip Abduction (0-40):     Hip extension (0-15):       STRENGTH: MMT deferred 2/2 to time constraints  RLE LLE  Hip Flexion    Hip Extension    Hip Abduction     Hip Adduction     Hip ER     Hip IR     Knee Extension    Knee Flexion    Dorsiflexion     Plantarflexion (seated)     ABDOMINAL: deferred 2/2 to time constraints Palpation: Diastasis: Scar mobility: Rib flare:  SPECIAL TESTS: deferred 2/2 to time constraints  PHYSICAL PERFORMANCE MEASURES: STS:  Deep Squat: RLE STS: LLE STS:  6MWT: 5TSTS:    EXTERNAL PELVIC EXAM: deferred 2/2 to time constraints Palpation: Breath coordination: Cued Lengthen: Cued Contraction: Cough:  INTERNAL VAGINAL EXAM: deferred 2/2 to time constraints Introitus Appears:  Skin integrity:  Scar mobility: Strength (PERF):  Symmetry: Palpation: Prolapse:   INTERNAL RECTAL EXAM: not indicated Strength (PERF): Symmetry: Palpation: Prolapse:   OUTCOME MEASURES: FOTO (Urinary 53)   ASSESSMENT Patient is a 70 year old presenting to clinic with chief complaints of mixed UI and chronic constipation with history of pelvic organ support deficits and surgical intervention. Evaluation today is suggestive of deficits in PFM coordination, PFM strength, posture,  and IAP management as evidenced by urinary leakage with physical stressors, straining with defecation, regular use of stool softeners, and posterior pelvic tilt with hip adduction in seated posture. Patient's responses on FOTO outcome measures (53) indicate significant functional limitations/disability/distress. Patient's progress may be limited due to persistence of complaint; however, patient's motivation is advantageous. Patient was able to achieve basic understanding of PFM functions during today's evaluation and responded positively to educational interventions. Patient will benefit from continued skilled therapeutic intervention to address deficits in PFM coordination, PFM strength, posture, and IAP managemen in order to increase function, and improve overall QOL.  EDUCATION Patient educated on prognosis, POC, and provided with HEP including: bladder diary, bowel diary. Patient articulated understanding and returned demonstration. Patient will benefit from further education in order to maximize compliance and understanding for long-term therapeutic gains.        Objective measurements completed on examination: See above findings.                    PT Long Term Goals - 07/30/20 1609  PT LONG TERM GOAL #1   Title Patient will demonstrate independence with HEP in order to maximize therapeutic gains and improve carryover from physical therapy sessions to ADLs in the home and community.    Baseline IE: not initiated    Time 12    Period Weeks    Status New    Target Date 10/22/20      PT LONG TERM GOAL #2   Title Patient will demonstrate improved function as evidenced by a score of 60 on FOTO measure for full participation in activities at home and in the community.    Baseline IE: 5    Time 12    Period Weeks    Status New    Target Date 10/22/20      PT LONG TERM GOAL #3   Title Patient will demonstrate circumferential and sequential contraction of >4/5 MMT, >  5 sec hold x5 and 5 consecutive quick flicks with </= 10 min rest between testing bouts, and relaxation of the PFM coordinated with breath for improved management of intra-abdominal pressure and normal bowel and bladder function without the presence of pain nor incontinence in order to improve participation at home and in the community.    Baseline IE: to be assessed at next visit    Time 12    Period Weeks    Status New    Target Date 10/22/20      PT LONG TERM GOAL #4   Title Patient will demonstrate coordinated lengthening and relaxation of PFM with diaphragmatic inhalation in order to decrease spasm and allow for unrestricted elimination of urine/feces for improved overall QOL.    Baseline IE: to be assessed at next visit    Time 12    Period Weeks    Status New    Target Date 10/22/20      PT LONG TERM GOAL #5   Title Patient will report decreased reliance (by type and number used) on protective undergarments to indicate improved bladder control for full participation at home and in the community.    Baseline IE: 1 depends/day    Time 12    Period Weeks    Status New    Target Date 10/22/20                  Plan - 07/30/20 0630    Clinical Impression Statement Patient is a 70 year old presenting to clinic with chief complaints of mixed UI and chronic constipation with history of pelvic organ support deficits and surgical intervention. Evaluation today is suggestive of deficits in PFM coordination, PFM strength, posture, and IAP management as evidenced by urinary leakage with physical stressors, straining with defecation, regular use of stool softeners, and posterior pelvic tilt with hip adduction in seated posture. Patient's responses on FOTO outcome measures (53) indicate significant functional limitations/disability/distress. Patient's progress may be limited due to persistence of complaint; however, patient's motivation is advantageous. Patient was able to achieve basic  understanding of PFM functions during today's evaluation and responded positively to educational interventions. Patient will benefit from continued skilled therapeutic intervention to address deficits in PFM coordination, PFM strength, posture, and IAP managemen in order to increase function, and improve overall QOL.    Personal Factors and Comorbidities Age;Comorbidity 3+;Past/Current Experience;Time since onset of injury/illness/exacerbation;Behavior Pattern;Sex    Comorbidities DM, diverticulitis, high cholesterol, TBI    Examination-Activity Limitations Locomotion Level;Stairs;Squat;Lift;Bend;Carry;Continence;Toileting    Examination-Participation Restrictions Cleaning;Laundry;Yard Work;Community Activity    Stability/Clinical Decision Making Evolving/Moderate complexity  Clinical Decision Making Moderate    Rehab Potential Fair    PT Frequency 1x / week    PT Duration 12 weeks    PT Treatment/Interventions ADLs/Self Care Home Management;Biofeedback;Cryotherapy;Electrical Stimulation;Moist Heat;Therapeutic exercise;Neuromuscular re-education;Patient/family education;Orthotic Fit/Training;Manual techniques;Scar mobilization;Taping;Spinal Manipulations;Joint Manipulations;Dry needling    PT Next Visit Plan physical assessment    PT Home Exercise Plan bowel and bladder diaries    Consulted and Agree with Plan of Care Patient           Patient will benefit from skilled therapeutic intervention in order to improve the following deficits and impairments:  Postural dysfunction,Improper body mechanics,Hypermobility,Decreased strength,Decreased coordination,Decreased activity tolerance,Decreased endurance  Visit Diagnosis: Other lack of coordination  Muscle weakness (generalized)     Problem List Patient Active Problem List   Diagnosis Date Noted  . Fall 05/10/2013  . Right clavicle fracture 05/10/2013  . Right radial head fracture 05/10/2013  . Scalp laceration 05/10/2013  .  Diverticular disease 05/10/2013  . DM (diabetes mellitus) ([place]) 05/10/2013  . Hyperlipidemia 05/10/2013  . TBI (traumatic brain injury) [facility]) 05/08/2013   Myles Gip PT, DPT [phone]  07/30/2020, 4:16 PM  [facility] [address]. [place], [place], [address] Phone: [phone]   Fax:  [phone]  Name: AZARIYA WALTMAN MRN: [id number] Date of Birth: 1951/03/04

## 2020-08-05 ENCOUNTER — Ambulatory Visit: Payer: Medicare PPO | Admitting: Physical Therapy

## 2020-08-05 ENCOUNTER — Other Ambulatory Visit: Payer: Self-pay

## 2020-08-05 ENCOUNTER — Encounter: Payer: Self-pay | Admitting: Physical Therapy

## 2020-08-05 DIAGNOSIS — R278 Other lack of coordination: Secondary | ICD-10-CM | POA: Diagnosis not present

## 2020-08-05 DIAGNOSIS — M6281 Muscle weakness (generalized): Secondary | ICD-10-CM

## 2020-08-05 NOTE — Therapy (Signed)
[place] [facility] [address] [place], [place], [address] Phone: [phone]   Fax:  [phone]  Physical Therapy Treatment  Patient Details  Name: TYSHA GRISMORE MRN: [id number] Date of Birth: [birth year] Referring Provider (PT): Annette Stable   Encounter Date: 08/05/2020   PT End of Session - 08/05/20 1015    Visit Number 2    Number of Visits 12    Date for PT Re-Evaluation 10/22/20    PT Start Time 8099    PT Stop Time 1055    PT Time Calculation (min) 40 min    Activity Tolerance Patient tolerated treatment well    Behavior During Therapy [facility] for tasks assessed/performed           Past Medical History:  Diagnosis Date  . Diabetes mellitus without complication ([place])   . Diverticulitis   . High cholesterol   . Melanoma [facility])     Past Surgical History:  Procedure Laterality Date  . ABDOMINAL HYSTERECTOMY    . INCONTINENCE SURGERY    . SACROSPINOUS LIGAMENT FIXATION    . SHOULDER SURGERY      There were no vitals filed for this visit.   Subjective Assessment - 08/05/20 1017    Subjective Patient notes no changes since evaluation. Patient presents to clinic with bladder diary which indicates some increased frequency in the presence of bladder irritants. Bladder diary also indicates much more burdensome stress component to UI than urge.    Currently in Pain? No/denies          TREATMENT  Pre-treatment assessment: L IC elevated ~ 1 cm above R IC. RANGE OF MOTION:    LEFT RIGHT  Lumbar forward flexion (65):  WNL    Lumbar extension (30): WNL    Lumbar lateral flexion (25):  WNL WNL  Thoracic and Lumbar rotation (30 degrees):    WNL WNL  Hip Flexion (0-125):   WNL WNL  Hip IR (0-45):  WNL WNL  Hip ER (0-45):  WNL WNL  Hip Abduction (0-40):  WNL WNL  Hip extension (0-15):  WNL WNL    SENSATION: Grossly intact to light touch bilateral LEs as determined by testing dermatomes L2-S2 Proprioception and  hot/cold testing deferred on this date  STRENGTH: MMT   RLE LLE  Hip Flexion 5 5  Hip Extension 5 5  Hip Abduction  5 5  Hip Adduction  5 5  Hip ER  5 5  Hip IR  5 5  Knee Extension 5 5  Knee Flexion 5 5  Dorsiflexion  5 5  Plantarflexion (seated) 5 5   ABDOMINAL:  Palpation: no TTP Diastasis: slightly > 2 fingers at umbilicus, WNL superior and inferior Rib flare: negative  SPECIAL TESTS: SLR (SN 92, -LR 0.29): R: Negative L:  Negative FABER (SN 81): R: Negative L: Negative FADIR (SN 94): R: Negative L: Negative  EXTERNAL PELVIC EXAM: Patient educated on the purpose of the pelvic exam and articulated understanding; patient consented to the exam verbally. Palpation: no TTP Breath coordination: present, unaware Cued Lengthen: unable Cued Contraction: 3/5 MMT, x3 reps, difficulty releasing contraction Cough: paradoxical  Neuromuscular Re-education: Patient education on bladder irritants and strategies for mitigating irritation.  Heel lift donning for RLE to distribute forces more evenly through the pelvis. Gait in hallway with heel lift.  Patient educated throughout session on appropriate technique and form using multi-modal cueing, HEP, and activity modification. Patient articulated understanding and returned demonstration.  Patient  Response to interventions: No concerns.   ASSESSMENT Patient presents to clinic with excellent motivation to participate in therapy. Patient demonstrates deficits in PFM coordination, PFM strength, posture, and IAP management. Patient able to achieve basic understanding of bladder irritants as well as PFM proprioception importance during today's session and responded positively to educational interventions. Patient will benefit from continued skilled therapeutic intervention to address remaining deficits in PFM coordination, PFM strength, posture, and IAP management in order to increase function and improve overall QOL.     PT Long Term Goals -  07/30/20 1609      PT LONG TERM GOAL #1   Title Patient will demonstrate independence with HEP in order to maximize therapeutic gains and improve carryover from physical therapy sessions to ADLs in the home and community.    Baseline IE: not initiated    Time 12    Period Weeks    Status New    Target Date 10/22/20      PT LONG TERM GOAL #2   Title Patient will demonstrate improved function as evidenced by a score of 60 on FOTO measure for full participation in activities at home and in the community.    Baseline IE: 82    Time 12    Period Weeks    Status New    Target Date 10/22/20      PT LONG TERM GOAL #3   Title Patient will demonstrate circumferential and sequential contraction of >4/5 MMT, > 5 sec hold x5 and 5 consecutive quick flicks with </= 10 min rest between testing bouts, and relaxation of the PFM coordinated with breath for improved management of intra-abdominal pressure and normal bowel and bladder function without the presence of pain nor incontinence in order to improve participation at home and in the community.    Baseline IE: to be assessed at next visit    Time 12    Period Weeks    Status New    Target Date 10/22/20      PT LONG TERM GOAL #4   Title Patient will demonstrate coordinated lengthening and relaxation of PFM with diaphragmatic inhalation in order to decrease spasm and allow for unrestricted elimination of urine/feces for improved overall QOL.    Baseline IE: to be assessed at next visit    Time 12    Period Weeks    Status New    Target Date 10/22/20      PT LONG TERM GOAL #5   Title Patient will report decreased reliance (by type and number used) on protective undergarments to indicate improved bladder control for full participation at home and in the community.    Baseline IE: 1 depends/day    Time 12    Period Weeks    Status New    Target Date 10/22/20                 Plan - 08/05/20 1015    Clinical Impression Statement  Patient presents to clinic with excellent motivation to participate in therapy. Patient demonstrates deficits in PFM coordination, PFM strength, posture, and IAP management. Patient able to achieve basic understanding of bladder irritants as well as PFM proprioception importance during today's session and responded positively to educational interventions. Patient will benefit from continued skilled therapeutic intervention to address remaining deficits in PFM coordination, PFM strength, posture, and IAP management in order to increase function and improve overall QOL.    Personal Factors and Comorbidities Age;Comorbidity 3+;Past/Current Experience;Time since onset of injury/illness/exacerbation;Behavior  Pattern;Sex    Comorbidities DM, diverticulitis, high cholesterol, TBI    Examination-Activity Limitations Locomotion Level;Stairs;Squat;Lift;Bend;Carry;Continence;Toileting    Examination-Participation Restrictions Cleaning;Laundry;Yard Work;Community Activity    Stability/Clinical Decision Making Evolving/Moderate complexity    Rehab Potential Fair    PT Frequency 1x / week    PT Duration 12 weeks    PT Treatment/Interventions ADLs/Self Care Home Management;Biofeedback;Cryotherapy;Electrical Stimulation;Moist Heat;Therapeutic exercise;Neuromuscular re-education;Patient/family education;Orthotic Fit/Training;Manual techniques;Scar mobilization;Taping;Spinal Manipulations;Joint Manipulations;Dry needling    PT Next Visit Plan PFM awareness and coordination    PT Home Exercise Plan bowel and bladder diaries    Consulted and Agree with Plan of Care Patient           Patient will benefit from skilled therapeutic intervention in order to improve the following deficits and impairments:  Postural dysfunction,Improper body mechanics,Hypermobility,Decreased strength,Decreased coordination,Decreased activity tolerance,Decreased endurance  Visit Diagnosis: Other lack of coordination  Muscle weakness  (generalized)     Problem List Patient Active Problem List   Diagnosis Date Noted  . Fall 05/10/2013  . Right clavicle fracture 05/10/2013  . Right radial head fracture 05/10/2013  . Scalp laceration 05/10/2013  . Diverticular disease 05/10/2013  . DM (diabetes mellitus) ([place]) 05/10/2013  . Hyperlipidemia 05/10/2013  . TBI (traumatic brain injury) [facility]) 05/08/2013   Myles Gip PT, DPT [phone]  08/05/2020, 3:50 PM  [place] [facility] [address] [place], [place], [address] Phone: [phone]   Fax:  [phone]  Name: AMARIE VILES MRN: [id number] Date of Birth: 05/05/1950

## 2020-08-07 NOTE — Addendum Note (Signed)
Addended by: Louie Casa on: 08/07/2020 02:51 PM   Modules accepted: Orders

## 2020-08-19 ENCOUNTER — Encounter: Payer: Self-pay | Admitting: Physical Therapy

## 2020-08-26 ENCOUNTER — Encounter: Payer: Self-pay | Admitting: Physical Therapy

## 2020-09-02 ENCOUNTER — Encounter: Payer: Self-pay | Admitting: Physical Therapy

## 2020-09-09 ENCOUNTER — Encounter: Payer: Self-pay | Admitting: Physical Therapy

## 2020-09-16 ENCOUNTER — Encounter: Payer: Self-pay | Admitting: Physical Therapy

## 2020-09-18 ENCOUNTER — Other Ambulatory Visit: Payer: Self-pay

## 2020-09-18 ENCOUNTER — Encounter: Payer: Self-pay | Admitting: Physical Therapy

## 2020-09-18 ENCOUNTER — Ambulatory Visit: Payer: Medicare PPO | Attending: Obstetrics and Gynecology | Admitting: Physical Therapy

## 2020-09-18 DIAGNOSIS — M6281 Muscle weakness (generalized): Secondary | ICD-10-CM | POA: Insufficient documentation

## 2020-09-18 DIAGNOSIS — R278 Other lack of coordination: Secondary | ICD-10-CM | POA: Insufficient documentation

## 2020-09-18 NOTE — Therapy (Signed)
[place] [facility] [address] [place], [place], [address] Phone: [phone]   Fax:  [phone]  Physical Therapy Treatment  Patient Details  Name: CARMELINA BALDUCCI MRN: [id number] Date of Birth: 1950/04/24 Referring Provider (PT): Annette Stable   Encounter Date: 09/18/2020   PT End of Session - 09/18/20 0802     Visit Number 3    Number of Visits 12    Date for PT Re-Evaluation 10/22/20    PT Start Time 0800    PT Stop Time 0845    PT Time Calculation (min) 45 min    Activity Tolerance Patient tolerated treatment well    Behavior During Therapy [facility] for tasks assessed/performed             Past Medical History:  Diagnosis Date   Diabetes mellitus without complication ([place])    Diverticulitis    High cholesterol    Melanoma ([place])     Past Surgical History:  Procedure Laterality Date   ABDOMINAL HYSTERECTOMY     INCONTINENCE SURGERY     SACROSPINOUS LIGAMENT FIXATION     SHOULDER SURGERY      There were no vitals filed for this visit.   Subjective Assessment - 09/18/20 0758     Subjective Patient denies any significant concerns since last visit. She notes that when she had a bad UTI about 3 weeks ago she had increased frequency, urgency, incontinence. Patient reports that since recovering from the UTI she has not noticed any increased incontinence.    Currently in Pain? No/denies              TREATMENT  Neuromuscular Re-education: Supine hooklying diaphragmatic breathing with VCs and TCs for downregulation of the nervous system and improved management of IAP Supine hooklying, PFM lengthening with inhalation. VCs and TCs to decrease compensatory patterns and encourage optimal relaxation of the PFM. Supine hooklying, TrA activation with exhalation and gentle compression for improved IAP management. VCs and TCs to decrease compensatory patterns and minimize aggravation of the lumbar paraspinals. Seated PFM  lengthening with inhalation. VCs and TCs to decrease compensatory patterns and encourage optimal relaxation of the PFM. Patient education on urge suppression techniques/strategies for improved bladder control.   Patient educated throughout session on appropriate technique and form using multi-modal cueing, HEP, and activity modification. Patient articulated understanding and returned demonstration.  Patient Response to interventions: Comfortable to focus on PFM awareness this week  ASSESSMENT Patient presents to clinic with excellent motivation to participate in therapy. Patient demonstrates deficits in PFM coordination, PFM strength, posture, and IAP management. Patient able to lengthen with coordinated breath pattern despite inconsistent proprioceptive awareness during today's session and responded positively to educational interventions. Patient will benefit from continued skilled therapeutic intervention to address remaining deficits in PFM coordination, PFM strength, posture, and IAP management in order to increase function and improve overall QOL.    PT Long Term Goals - 07/30/20 1609       PT LONG TERM GOAL #1   Title Patient will demonstrate independence with HEP in order to maximize therapeutic gains and improve carryover from physical therapy sessions to ADLs in the home and community.    Baseline IE: not initiated    Time 12    Period Weeks    Status New    Target Date 10/22/20      PT LONG TERM GOAL #2   Title Patient will demonstrate improved function as evidenced by a score of 60  on FOTO measure for full participation in activities at home and in the community.    Baseline IE: 31    Time 12    Period Weeks    Status New    Target Date 10/22/20      PT LONG TERM GOAL #3   Title Patient will demonstrate circumferential and sequential contraction of >4/5 MMT, > 5 sec hold x5 and 5 consecutive quick flicks with </= 10 min rest between testing bouts, and relaxation of the  PFM coordinated with breath for improved management of intra-abdominal pressure and normal bowel and bladder function without the presence of pain nor incontinence in order to improve participation at home and in the community.    Baseline IE: to be assessed at next visit    Time 12    Period Weeks    Status New    Target Date 10/22/20      PT LONG TERM GOAL #4   Title Patient will demonstrate coordinated lengthening and relaxation of PFM with diaphragmatic inhalation in order to decrease spasm and allow for unrestricted elimination of urine/feces for improved overall QOL.    Baseline IE: to be assessed at next visit    Time 12    Period Weeks    Status New    Target Date 10/22/20      PT LONG TERM GOAL #5   Title Patient will report decreased reliance (by type and number used) on protective undergarments to indicate improved bladder control for full participation at home and in the community.    Baseline IE: 1 depends/day    Time 12    Period Weeks    Status New    Target Date 10/22/20                   Plan - 09/18/20 0803     Clinical Impression Statement Patient presents to clinic with excellent motivation to participate in therapy. Patient demonstrates deficits in PFM coordination, PFM strength, posture, and IAP management. Patient able to lengthen with coordinated breath pattern despite inconsistent proprioceptive awareness during today's session and responded positively to educational interventions. Patient will benefit from continued skilled therapeutic intervention to address remaining deficits in PFM coordination, PFM strength, posture, and IAP management in order to increase function and improve overall QOL.    Personal Factors and Comorbidities Age;Comorbidity 3+;Past/Current Experience;Time since onset of injury/illness/exacerbation;Behavior Pattern;Sex    Comorbidities DM, diverticulitis, high cholesterol, TBI    Examination-Activity Limitations Locomotion  Level;Stairs;Squat;Lift;Bend;Carry;Continence;Toileting    Examination-Participation Restrictions Cleaning;Laundry;Yard Work;Community Activity    Stability/Clinical Decision Making Evolving/Moderate complexity    Rehab Potential Fair    PT Frequency 1x / week    PT Duration 12 weeks    PT Treatment/Interventions ADLs/Self Care Home Management;Biofeedback;Cryotherapy;Electrical Stimulation;Moist Heat;Therapeutic exercise;Neuromuscular re-education;Patient/family education;Orthotic Fit/Training;Manual techniques;Scar mobilization;Taping;Spinal Manipulations;Joint Manipulations;Dry needling    PT Next Visit Plan PFM awareness and coordination    PT Home Exercise Plan bowel and bladder diaries    Consulted and Agree with Plan of Care Patient             Patient will benefit from skilled therapeutic intervention in order to improve the following deficits and impairments:  Postural dysfunction, Improper body mechanics, Hypermobility, Decreased strength, Decreased coordination, Decreased activity tolerance, Decreased endurance  Visit Diagnosis: Other lack of coordination  Muscle weakness (generalized)     Problem List Patient Active Problem List   Diagnosis Date Noted   Fall 05/10/2013   Right clavicle fracture 05/10/2013  Right radial head fracture 05/10/2013   Scalp laceration 05/10/2013   Diverticular disease 05/10/2013   DM (diabetes mellitus) ([place]) 05/10/2013   Hyperlipidemia 05/10/2013   TBI (traumatic brain injury) [facility]) 05/08/2013    Myles Gip PT, DPT [phone]  09/18/2020, 9:20 AM  [facility] [address] [place], [place], [address] Phone: [phone]   Fax:  [phone]  Name: BICH MCHANEY MRN: [id number] Date of Birth: [birth year]

## 2020-09-23 ENCOUNTER — Ambulatory Visit: Payer: Medicare PPO | Admitting: Physical Therapy

## 2020-09-23 ENCOUNTER — Other Ambulatory Visit: Payer: Self-pay

## 2020-09-23 ENCOUNTER — Encounter: Payer: Self-pay | Admitting: Physical Therapy

## 2020-09-23 DIAGNOSIS — M6281 Muscle weakness (generalized): Secondary | ICD-10-CM

## 2020-09-23 DIAGNOSIS — R278 Other lack of coordination: Secondary | ICD-10-CM

## 2020-09-23 NOTE — Therapy (Signed)
[place] [facility] [address] [place], [place], [address] Phone: [phone]   Fax:  [phone]  Physical Therapy Treatment  Patient Details  Name: Haley Ruiz MRN: [id number] Date of Birth: [birth year] Referring Provider (PT): Annette Stable   Encounter Date: 09/23/2020   PT End of Session - 09/23/20 1028     Visit Number 4    Number of Visits 12    Date for PT Re-Evaluation 10/22/20    PT Start Time 4496    PT Stop Time 1055    PT Time Calculation (min) 40 min    Activity Tolerance Patient tolerated treatment well    Behavior During Therapy [facility] for tasks assessed/performed             Past Medical History:  Diagnosis Date   Diabetes mellitus without complication ([place])    Diverticulitis    High cholesterol    Melanoma ([place])     Past Surgical History:  Procedure Laterality Date   ABDOMINAL HYSTERECTOMY     INCONTINENCE SURGERY     SACROSPINOUS LIGAMENT FIXATION     SHOULDER SURGERY      There were no vitals filed for this visit.   Subjective Assessment - 09/23/20 1017     Subjective Patient notes that she had urodynamics test done and leaked durign the test. Test results showed pelvic floor muscle relaxation was absent during the voiding phase. Patient has a consult with urogyn coming up regarding using bulking as an adjunct. Patient notes that the urge suppression strategies have been helpful, but she has not noted any significant impact with PFM awareness activities.    Currently in Pain? No/denies            TREATMENT  Neuromuscular Re-education: Supine hooklying diaphragmatic breathing with VCs and TCs for downregulation of the nervous system and improved management of IAP Supine hooklying, PFM lengthening with inhalation. VCs and TCs to decrease compensatory patterns and encourage optimal relaxation of the PFM. Supine pelvic tilts with coordinated breath for improved pelvic floor mobility and  awareness Supine figure 4 stretch with PFM lengthening for improved PFM proprioception and control Patient education on hamstring and adductor stretches with PFM lengthening for improved awareness and control   Patient educated throughout session on appropriate technique and form using multi-modal cueing, HEP, and activity modification. Patient articulated understanding and returned demonstration.  Patient Response to interventions: Adding stretches until next visit  ASSESSMENT Patient presents to clinic with excellent motivation to participate in therapy. Patient demonstrates deficits in PFM coordination, PFM strength, posture, and IAP management. Patient with improved PFM coordination for relaxation phase when in hip stretch position during today's session and responded positively to educational interventions. Patient will benefit from continued skilled therapeutic intervention to address remaining deficits in PFM coordination, PFM strength, posture, and IAP management in order to increase function and improve overall QOL.   PT Long Term Goals - 07/30/20 1609       PT LONG TERM GOAL #1   Title Patient will demonstrate independence with HEP in order to maximize therapeutic gains and improve carryover from physical therapy sessions to ADLs in the home and community.    Baseline IE: not initiated    Time 12    Period Weeks    Status New    Target Date 10/22/20      PT LONG TERM GOAL #2   Title Patient will demonstrate improved function as evidenced by a score of 60  on FOTO measure for full participation in activities at home and in the community.    Baseline IE: 18    Time 12    Period Weeks    Status New    Target Date 10/22/20      PT LONG TERM GOAL #3   Title Patient will demonstrate circumferential and sequential contraction of >4/5 MMT, > 5 sec hold x5 and 5 consecutive quick flicks with </= 10 min rest between testing bouts, and relaxation of the PFM coordinated with breath for  improved management of intra-abdominal pressure and normal bowel and bladder function without the presence of pain nor incontinence in order to improve participation at home and in the community.    Baseline IE: to be assessed at next visit    Time 12    Period Weeks    Status New    Target Date 10/22/20      PT LONG TERM GOAL #4   Title Patient will demonstrate coordinated lengthening and relaxation of PFM with diaphragmatic inhalation in order to decrease spasm and allow for unrestricted elimination of urine/feces for improved overall QOL.    Baseline IE: to be assessed at next visit    Time 12    Period Weeks    Status New    Target Date 10/22/20      PT LONG TERM GOAL #5   Title Patient will report decreased reliance (by type and number used) on protective undergarments to indicate improved bladder control for full participation at home and in the community.    Baseline IE: 1 depends/day    Time 12    Period Weeks    Status New    Target Date 10/22/20                   Plan - 09/23/20 1029     Clinical Impression Statement Patient presents to clinic with excellent motivation to participate in therapy. Patient demonstrates deficits in PFM coordination, PFM strength, posture, and IAP management. Patient with improved PFM coordination for relaxation phase when in hip stretch position during today's session and responded positively to educational interventions. Patient will benefit from continued skilled therapeutic intervention to address remaining deficits in PFM coordination, PFM strength, posture, and IAP management in order to increase function and improve overall QOL.    Personal Factors and Comorbidities Age;Comorbidity 3+;Past/Current Experience;Time since onset of injury/illness/exacerbation;Behavior Pattern;Sex    Comorbidities DM, diverticulitis, high cholesterol, TBI    Examination-Activity Limitations Locomotion  Level;Stairs;Squat;Lift;Bend;Carry;Continence;Toileting    Examination-Participation Restrictions Cleaning;Laundry;Yard Work;Community Activity    Stability/Clinical Decision Making Evolving/Moderate complexity    Rehab Potential Fair    PT Frequency 1x / week    PT Duration 12 weeks    PT Treatment/Interventions ADLs/Self Care Home Management;Biofeedback;Cryotherapy;Electrical Stimulation;Moist Heat;Therapeutic exercise;Neuromuscular re-education;Patient/family education;Orthotic Fit/Training;Manual techniques;Scar mobilization;Taping;Spinal Manipulations;Joint Manipulations;Dry needling    PT Next Visit Plan PFM awareness and coordination    PT Home Exercise Plan bowel and bladder diaries    Consulted and Agree with Plan of Care Patient             Patient will benefit from skilled therapeutic intervention in order to improve the following deficits and impairments:  Postural dysfunction, Improper body mechanics, Hypermobility, Decreased strength, Decreased coordination, Decreased activity tolerance, Decreased endurance  Visit Diagnosis: Other lack of coordination  Muscle weakness (generalized)     Problem List Patient Active Problem List   Diagnosis Date Noted   Fall 05/10/2013   Right clavicle fracture 05/10/2013  Right radial head fracture 05/10/2013   Scalp laceration 05/10/2013   Diverticular disease 05/10/2013   DM (diabetes mellitus) ([place]) 05/10/2013   Hyperlipidemia 05/10/2013   TBI (traumatic brain injury) [facility]) 05/08/2013    Myles Gip PT, DPT [phone]  09/23/2020, 12:59 PM  [place] [facility] [address] [place], [place], [address] Phone: [phone]   Fax:  [phone]  Name: Haley Ruiz MRN: [id number] Date of Birth: [birth year]

## 2020-10-07 ENCOUNTER — Other Ambulatory Visit: Payer: Self-pay

## 2020-10-07 ENCOUNTER — Ambulatory Visit: Payer: Medicare PPO | Attending: Obstetrics and Gynecology | Admitting: Physical Therapy

## 2020-10-07 ENCOUNTER — Encounter: Payer: Self-pay | Admitting: Physical Therapy

## 2020-10-07 DIAGNOSIS — R278 Other lack of coordination: Secondary | ICD-10-CM | POA: Insufficient documentation

## 2020-10-07 DIAGNOSIS — M6281 Muscle weakness (generalized): Secondary | ICD-10-CM | POA: Diagnosis present

## 2020-10-07 NOTE — Therapy (Signed)
[facility] [address] [place], [place], [address] Phone: [phone]   Fax:  [phone]  Physical Therapy Treatment  Patient Details  Name: Haley Ruiz MRN: [id number] Date of Birth: [birth year] Referring Provider (PT): Annette Stable   Encounter Date: 10/07/2020   PT End of Session - 10/07/20 1034     Visit Number 5    Number of Visits 12    Date for PT Re-Evaluation 10/22/20    PT Start Time 1030    PT Stop Time 1110    PT Time Calculation (min) 40 min    Activity Tolerance Patient tolerated treatment well    Behavior During Therapy [facility] for tasks assessed/performed             Past Medical History:  Diagnosis Date   Diabetes mellitus without complication ([place])    Diverticulitis    High cholesterol    Melanoma ([place])     Past Surgical History:  Procedure Laterality Date   ABDOMINAL HYSTERECTOMY     INCONTINENCE SURGERY     SACROSPINOUS LIGAMENT FIXATION     SHOULDER SURGERY      There were no vitals filed for this visit.   Subjective Assessment - 10/07/20 1032     Subjective Patient notes that she has not had any significant changes since last visit. She does report that with a full house of guests, she didn't have much time to do exercises. Notes that she has more leakage with stair descent as opposed to ascent. Patient notes that she continues to be able to suppress urge when needed.    Currently in Pain? No/denies             TREATMENT  Neuromuscular Re-education: Supine hooklying diaphragmatic breathing with VCs and TCs for downregulation of the nervous system and improved management of IAP Supine hooklying, PFM lengthening with inhalation. VCs and TCs to decrease compensatory patterns and encourage optimal relaxation of the PFM. Supine hooklying, PFM contractions with exhalation. VCs and TCs to decrease compensatory patterns and encourage activation of the PFM. Standing Pilates  Postural Control Hug a Tree, RTB   Serve a Tray, RTB     Patient educated throughout session on appropriate technique and form using multi-modal cueing, HEP, and activity modification. Patient articulated understanding and returned demonstration.  Patient Response to interventions: Comfortable to work independently for 2 weeks.  ASSESSMENT Patient presents to clinic with excellent motivation to participate in therapy. Patient demonstrates deficits in PFM coordination, PFM strength, posture, and IAP management. Patient able to coordinate PFM contraction for 4 consecutive repetitions with equal quality and no compensations during today's session and responded positively to active  interventions. Patient will benefit from continued skilled therapeutic intervention to address remaining deficits in PFM coordination, PFM strength, posture, and IAP management in order to increase function and improve overall QOL.     PT Long Term Goals - 07/30/20 1609       PT LONG TERM GOAL #1   Title Patient will demonstrate independence with HEP in order to maximize therapeutic gains and improve carryover from physical therapy sessions to ADLs in the home and community.    Baseline IE: not initiated    Time 12    Period Weeks    Status New    Target Date 10/22/20      PT LONG TERM GOAL #2   Title Patient will demonstrate improved function as evidenced by a score of 60 on FOTO  measure for full participation in activities at home and in the community.    Baseline IE: 15    Time 12    Period Weeks    Status New    Target Date 10/22/20      PT LONG TERM GOAL #3   Title Patient will demonstrate circumferential and sequential contraction of >4/5 MMT, > 5 sec hold x5 and 5 consecutive quick flicks with </= 10 min rest between testing bouts, and relaxation of the PFM coordinated with breath for improved management of intra-abdominal pressure and normal bowel and bladder function without the presence of pain  nor incontinence in order to improve participation at home and in the community.    Baseline IE: to be assessed at next visit    Time 12    Period Weeks    Status New    Target Date 10/22/20      PT LONG TERM GOAL #4   Title Patient will demonstrate coordinated lengthening and relaxation of PFM with diaphragmatic inhalation in order to decrease spasm and allow for unrestricted elimination of urine/feces for improved overall QOL.    Baseline IE: to be assessed at next visit    Time 12    Period Weeks    Status New    Target Date 10/22/20      PT LONG TERM GOAL #5   Title Patient will report decreased reliance (by type and number used) on protective undergarments to indicate improved bladder control for full participation at home and in the community.    Baseline IE: 1 depends/day    Time 12    Period Weeks    Status New    Target Date 10/22/20                   Plan - 10/07/20 1035     Clinical Impression Statement Patient presents to clinic with excellent motivation to participate in therapy. Patient demonstrates deficits in PFM coordination, PFM strength, posture, and IAP management. Patient able to coordinate PFM contraction for 4 consecutive repetitions with equal quality and no compensations during today's session and responded positively to active  interventions. Patient will benefit from continued skilled therapeutic intervention to address remaining deficits in PFM coordination, PFM strength, posture, and IAP management in order to increase function and improve overall QOL.    Personal Factors and Comorbidities Age;Comorbidity 3+;Past/Current Experience;Time since onset of injury/illness/exacerbation;Behavior Pattern;Sex    Comorbidities DM, diverticulitis, high cholesterol, TBI    Examination-Activity Limitations Locomotion Level;Stairs;Squat;Lift;Bend;Carry;Continence;Toileting    Examination-Participation Restrictions Cleaning;Laundry;Yard Work;Community Activity     Stability/Clinical Decision Making Evolving/Moderate complexity    Rehab Potential Fair    PT Frequency 1x / week    PT Duration 12 weeks    PT Treatment/Interventions ADLs/Self Care Home Management;Biofeedback;Cryotherapy;Electrical Stimulation;Moist Heat;Therapeutic exercise;Neuromuscular re-education;Patient/family education;Orthotic Fit/Training;Manual techniques;Scar mobilization;Taping;Spinal Manipulations;Joint Manipulations;Dry needling    PT Next Visit Plan PFM awareness and coordination    PT Home Exercise Plan bowel and bladder diaries    Consulted and Agree with Plan of Care Patient             Patient will benefit from skilled therapeutic intervention in order to improve the following deficits and impairments:  Postural dysfunction, Improper body mechanics, Hypermobility, Decreased strength, Decreased coordination, Decreased activity tolerance, Decreased endurance  Visit Diagnosis: Other lack of coordination  Muscle weakness (generalized)     Problem List Patient Active Problem List   Diagnosis Date Noted   Fall 05/10/2013   Right clavicle  fracture 05/10/2013   Right radial head fracture 05/10/2013   Scalp laceration 05/10/2013   Diverticular disease 05/10/2013   DM (diabetes mellitus) ([place]) 05/10/2013   Hyperlipidemia 05/10/2013   TBI (traumatic brain injury) [facility]) 05/08/2013   Myles Gip PT, DPT [phone]  10/07/2020, 3:39 PM  [place] [facility] [address] [place], [place], [address] Phone: [phone]   Fax:  [phone]  Name: Haley Ruiz MRN: [id number] Date of Birth: [birth year]

## 2020-10-14 ENCOUNTER — Encounter: Payer: Medicare PPO | Admitting: Physical Therapy

## 2020-10-28 ENCOUNTER — Ambulatory Visit: Payer: Medicare PPO | Attending: Obstetrics and Gynecology | Admitting: Physical Therapy

## 2020-10-28 DIAGNOSIS — M6281 Muscle weakness (generalized): Secondary | ICD-10-CM | POA: Insufficient documentation

## 2020-10-28 DIAGNOSIS — R278 Other lack of coordination: Secondary | ICD-10-CM | POA: Insufficient documentation

## 2020-11-11 ENCOUNTER — Other Ambulatory Visit: Payer: Self-pay

## 2020-11-11 ENCOUNTER — Ambulatory Visit: Payer: Medicare PPO | Admitting: Physical Therapy

## 2020-11-11 ENCOUNTER — Encounter: Payer: Self-pay | Admitting: Physical Therapy

## 2020-11-11 DIAGNOSIS — R278 Other lack of coordination: Secondary | ICD-10-CM

## 2020-11-11 DIAGNOSIS — M6281 Muscle weakness (generalized): Secondary | ICD-10-CM | POA: Diagnosis present

## 2020-11-11 NOTE — Therapy (Signed)
Haley Ruiz Haley Ruiz [address] [place], [place], [address] Phone: [phone]   Fax:  [phone]  Physical Therapy Treatment/Discharge  Patient Details  Name: Haley Ruiz MRN: [id number] Date of Birth: 16-Jan-1951 Referring Provider (PT): Annette Stable   Encounter Date: 11/11/2020   PT End of Session - 11/11/20 1037     Visit Number 6    Number of Visits 12    Date for PT Re-Evaluation 11/11/20    PT Start Time 1030    PT Stop Time 1100    PT Time Calculation (min) 30 min    Activity Tolerance Patient tolerated treatment well    Behavior During Therapy [facility] for tasks assessed/performed             Past Medical History:  Diagnosis Date   Diabetes mellitus without complication ([place])    Diverticulitis    High cholesterol    Melanoma ([place])     Past Surgical History:  Procedure Laterality Date   ABDOMINAL HYSTERECTOMY     INCONTINENCE SURGERY     SACROSPINOUS LIGAMENT FIXATION     SHOULDER SURGERY      There were no vitals filed for this visit.   Subjective Assessment - 11/11/20 1035     Subjective Patient notes that she had follow up with urogyn and is going to proceed with surgery for UI. Patient is awaiting scheduling. Patient notes she hasn't had any significant changes in UI with PFM training. Continues to be able to apply urge suppression strategies.    Currently in Pain? No/denies             TREATMENT  Neuromuscular Re-education: Reassessed goals; see below.  Supine hooklying diaphragmatic breathing with VCs and TCs for downregulation of the nervous system and improved management of IAP Supine hooklying, PFM lengthening with inhalation. VCs and TCs to decrease compensatory patterns and encourage optimal relaxation of the PFM. Supine hooklying, PFM contractions with exhalation. VCs and TCs to decrease compensatory patterns and encourage activation of the PFM.    Patient educated throughout  session on appropriate technique and form using multi-modal cueing, HEP, and activity modification. Patient articulated understanding and returned demonstration.  Patient Response to interventions: Comfortable to discharge and proceed with surgery.  ASSESSMENT Patient presents to clinic with excellent motivation to participate in therapy and readiness to discharge. Patient demonstrates minimal deficits in PFM coordination, PFM strength, posture, and IAP management, but continues to be bothered by UI. Patient has developed PFM strength to 3/5 with palpable squeeze and lift and is able to coordinate 6 sec hold for 5 repetitions and 5 fast-twitch muscle fiber contractions. Patient articulating adequate understanding of HEP during today's session and is appropriate to discharge to self-management and follow-up with surgical intervention as she has not achieved meaningful functional change in her chief complaint, UI. Patient may benefit from continued skilled therapeutic intervention post-surgically for any remaining coordination, strength, or pressure management deficits in order to increase function and improve overall QOL.    PT Long Term Goals - 11/11/20 1040       PT LONG TERM GOAL #1   Title Patient will demonstrate independence with HEP in order to maximize therapeutic gains and improve carryover from physical therapy sessions to ADLs in the home and community.    Baseline IE: not initiated; 8/16: IND    Time 12    Period Weeks    Status Achieved      PT LONG TERM GOAL #  2   Title Patient will demonstrate improved function as evidenced by a score of 60 on FOTO measure for full participation in activities at home and in the community.    Baseline IE: 24; 8/16: 51    Time 12    Period Weeks    Status Not Met      PT LONG TERM GOAL #3   Title Patient will demonstrate circumferential and sequential contraction of >4/5 MMT, > 5 sec hold x5 and 5 consecutive quick flicks with </= 10 min rest  between testing bouts, and relaxation of the PFM coordinated with breath for improved management of intra-abdominal pressure and normal bowel and bladder function without the presence of pain nor incontinence in order to improve participation at home and in the community.    Baseline 8/16: 3/5, x5 quick flicks, 6 sec x5    Time 12    Period Weeks    Status Partially Met      PT LONG TERM GOAL #4   Title Patient will demonstrate coordinated lengthening and relaxation of PFM with diaphragmatic inhalation in order to decrease spasm and allow for unrestricted elimination of urine/feces for improved overall QOL.    Baseline 8/16: IND    Time 12    Period Weeks    Status Achieved      PT LONG TERM GOAL #5   Title Patient will report decreased reliance (by type and number used) on protective undergarments to indicate improved bladder control for full participation at home and in the community.    Baseline IE: 1 depends/day; 8/16: occasional use of > 1/day    Time 12    Period Weeks    Status Not Met                   Plan - 11/11/20 1042     Clinical Impression Statement Patient presents to clinic with excellent motivation to participate in therapy and readiness to discharge. Patient demonstrates minimal deficits in PFM coordination, PFM strength, posture, and IAP management, but continues to be bothered by UI. Patient has developed PFM strength to 3/5 with palpable squeeze and lift and is able to coordinate 6 sec hold for 5 repetitions and 5 fast-twitch muscle fiber contractions. Patient articulating adequate understanding of HEP during today's session and is appropriate to discharge to self-management and follow-up with surgical intervention as she has not achieved meaningful functional change in her chief complaint, UI. Patient may benefit from continued skilled therapeutic intervention post-surgically for any remaining coordination, strength, or pressure management deficits in order to  increase function and improve overall QOL.    Personal Factors and Comorbidities Age;Comorbidity 3+;Past/Current Experience;Time since onset of injury/illness/exacerbation;Behavior Pattern;Sex    Comorbidities DM, diverticulitis, high cholesterol, TBI    Examination-Activity Limitations Locomotion Level;Stairs;Squat;Lift;Bend;Carry;Continence;Toileting    Examination-Participation Restrictions Cleaning;Laundry;Yard Work;Community Activity    Stability/Clinical Decision Making Evolving/Moderate complexity    Rehab Potential Fair    PT Frequency 1x / week    PT Duration 12 weeks    PT Treatment/Interventions ADLs/Self Care Home Management;Biofeedback;Cryotherapy;Electrical Stimulation;Moist Heat;Therapeutic exercise;Neuromuscular re-education;Patient/family education;Orthotic Fit/Training;Manual techniques;Scar mobilization;Taping;Spinal Manipulations;Joint Manipulations;Dry needling    Consulted and Agree with Plan of Care Patient             Patient will benefit from skilled therapeutic intervention in order to improve the following deficits and impairments:  Postural dysfunction, Improper body mechanics, Hypermobility, Decreased strength, Decreased coordination, Decreased activity tolerance, Decreased endurance  Visit Diagnosis: Other lack of coordination  Muscle  weakness (generalized)     Problem List Patient Active Problem List   Diagnosis Date Noted   Fall 05/10/2013   Right clavicle fracture 05/10/2013   Right radial head fracture 05/10/2013   Scalp laceration 05/10/2013   Diverticular disease 05/10/2013   DM (diabetes mellitus) ([place]) 05/10/2013   Hyperlipidemia 05/10/2013   TBI (traumatic brain injury) [facility]) 05/08/2013    Myles Gip PT, DPT [phone]  11/11/2020, 11:25 AM  [place] [facility] [address] [place], [place], [address] Phone: [phone]   Fax:  [phone]  Name: Haley Ruiz MRN: [id number] Date of Birth:  November 26, 1950
